# Patient Record
Sex: Female | Born: 1955 | Race: White | Hispanic: No | Marital: Married | Smoking: Former smoker
Health system: Southern US, Community
[De-identification: ages and names within clinical notes are randomized; demographics above are authoritative.]

## PROBLEM LIST (undated history)

## (undated) DIAGNOSIS — M1990 Unspecified osteoarthritis, unspecified site: Secondary | ICD-10-CM

## (undated) DIAGNOSIS — F419 Anxiety disorder, unspecified: Secondary | ICD-10-CM

## (undated) DIAGNOSIS — E785 Hyperlipidemia, unspecified: Secondary | ICD-10-CM

## (undated) DIAGNOSIS — R7303 Prediabetes: Secondary | ICD-10-CM

## (undated) DIAGNOSIS — Z9889 Other specified postprocedural states: Secondary | ICD-10-CM

## (undated) DIAGNOSIS — R112 Nausea with vomiting, unspecified: Secondary | ICD-10-CM

## (undated) DIAGNOSIS — D649 Anemia, unspecified: Secondary | ICD-10-CM

## (undated) DIAGNOSIS — I10 Essential (primary) hypertension: Secondary | ICD-10-CM

## (undated) DIAGNOSIS — K5792 Diverticulitis of intestine, part unspecified, without perforation or abscess without bleeding: Secondary | ICD-10-CM

## (undated) HISTORY — PX: ABDOMINAL HYSTERECTOMY: SHX81

## (undated) HISTORY — PX: WISDOM TOOTH EXTRACTION: SHX21

## (undated) HISTORY — PX: TUBAL LIGATION: SHX77

---

## 2003-12-22 HISTORY — PX: OTHER SURGICAL HISTORY: SHX169

## 2009-04-05 ENCOUNTER — Inpatient Hospital Stay: Payer: Self-pay | Admitting: Psychiatry

## 2009-10-23 ENCOUNTER — Emergency Department: Payer: Self-pay | Admitting: Emergency Medicine

## 2010-11-15 ENCOUNTER — Emergency Department: Payer: Self-pay | Admitting: Emergency Medicine

## 2010-12-27 ENCOUNTER — Inpatient Hospital Stay (HOSPITAL_COMMUNITY): Admission: EM | Admit: 2010-12-27 | Discharge: 2010-12-30 | Payer: Self-pay | Source: Home / Self Care

## 2011-01-05 LAB — CBC
HCT: 38.4 % (ref 36.0–46.0)
HCT: 35.9 % — ABNORMAL LOW (ref 36.0–46.0)
HCT: 33.7 % — ABNORMAL LOW (ref 36.0–46.0)
Hemoglobin: 13.8 g/dL (ref 12.0–15.0)
Hemoglobin: 12.6 g/dL (ref 12.0–15.0)
Hemoglobin: 12 g/dL (ref 12.0–15.0)
MCH: 34.8 pg — ABNORMAL HIGH (ref 26.0–34.0)
MCH: 35.1 pg — ABNORMAL HIGH (ref 26.0–34.0)
MCH: 35.4 pg — ABNORMAL HIGH (ref 26.0–34.0)
MCHC: 35.9 g/dL (ref 30.0–36.0)
MCHC: 35.1 g/dL (ref 30.0–36.0)
MCHC: 35.6 g/dL (ref 30.0–36.0)
MCV: 97 fL (ref 78.0–100.0)
MCV: 100 fL (ref 78.0–100.0)
MCV: 99.4 fL (ref 78.0–100.0)
Platelets: 138 10*3/uL — ABNORMAL LOW (ref 150–400)
Platelets: 118 10*3/uL — ABNORMAL LOW (ref 150–400)
Platelets: 141 10*3/uL — ABNORMAL LOW (ref 150–400)
RBC: 3.96 MIL/uL (ref 3.87–5.11)
RBC: 3.59 MIL/uL — ABNORMAL LOW (ref 3.87–5.11)
RBC: 3.39 MIL/uL — ABNORMAL LOW (ref 3.87–5.11)
RDW: 12.5 % (ref 11.5–15.5)
RDW: 12.6 % (ref 11.5–15.5)
RDW: 12.3 % (ref 11.5–15.5)
WBC: 7.1 10*3/uL (ref 4.0–10.5)
WBC: 5.2 10*3/uL (ref 4.0–10.5)
WBC: 5.4 10*3/uL (ref 4.0–10.5)

## 2011-01-05 LAB — URINALYSIS, ROUTINE W REFLEX MICROSCOPIC
Bilirubin Urine: NEGATIVE
Ketones, ur: NEGATIVE mg/dL
Leukocytes, UA: NEGATIVE
Nitrite: NEGATIVE
Protein, ur: NEGATIVE mg/dL
Specific Gravity, Urine: 1.02 (ref 1.005–1.030)
Urine Glucose, Fasting: NEGATIVE mg/dL
Urobilinogen, UA: 0.2 mg/dL (ref 0.0–1.0)
pH: 6 (ref 5.0–8.0)

## 2011-01-05 LAB — LIPID PANEL
Cholesterol: 180 mg/dL (ref 0–200)
HDL: 83 mg/dL (ref >39)
LDL Cholesterol: 90 mg/dL (ref 0–99)
Total CHOL/HDL Ratio: 2.2 RATIO
Triglycerides: 34 mg/dL (ref <150)
VLDL: 7 mg/dL (ref 0–40)

## 2011-01-05 LAB — MRSA PCR SCREENING: MRSA by PCR: NEGATIVE

## 2011-01-05 LAB — DIFFERENTIAL
Basophils Absolute: 0 10*3/uL (ref 0.0–0.1)
Basophils Absolute: 0 10*3/uL (ref 0.0–0.1)
Basophils Absolute: 0 10*3/uL (ref 0.0–0.1)
Basophils Relative: 0 % (ref 0–1)
Basophils Relative: 1 % (ref 0–1)
Basophils Relative: 1 % (ref 0–1)
Eosinophils Absolute: 0.1 10*3/uL (ref 0.0–0.7)
Eosinophils Absolute: 0.2 10*3/uL (ref 0.0–0.7)
Eosinophils Absolute: 0.1 10*3/uL (ref 0.0–0.7)
Eosinophils Relative: 1 % (ref 0–5)
Eosinophils Relative: 3 % (ref 0–5)
Eosinophils Relative: 2 % (ref 0–5)
Lymphocytes Relative: 25 % (ref 12–46)
Lymphocytes Relative: 31 % (ref 12–46)
Lymphocytes Relative: 35 % (ref 12–46)
Lymphs Abs: 1.8 10*3/uL (ref 0.7–4.0)
Lymphs Abs: 1.6 10*3/uL (ref 0.7–4.0)
Lymphs Abs: 1.9 10*3/uL (ref 0.7–4.0)
Monocytes Absolute: 0.6 10*3/uL (ref 0.1–1.0)
Monocytes Absolute: 0.6 10*3/uL (ref 0.1–1.0)
Monocytes Absolute: 0.6 10*3/uL (ref 0.1–1.0)
Monocytes Relative: 9 % (ref 3–12)
Monocytes Relative: 11 % (ref 3–12)
Monocytes Relative: 10 % (ref 3–12)
Neutro Abs: 4.6 10*3/uL (ref 1.7–7.7)
Neutro Abs: 2.8 10*3/uL (ref 1.7–7.7)
Neutro Abs: 2.8 10*3/uL (ref 1.7–7.7)
Neutrophils Relative %: 65 % (ref 43–77)
Neutrophils Relative %: 54 % (ref 43–77)
Neutrophils Relative %: 52 % (ref 43–77)

## 2011-01-05 LAB — RAPID URINE DRUG SCREEN, HOSP PERFORMED
Amphetamines: NOT DETECTED
Barbiturates: NOT DETECTED
Benzodiazepines: NOT DETECTED
Cocaine: NOT DETECTED
Opiates: NOT DETECTED
Tetrahydrocannabinol: POSITIVE — AB

## 2011-01-05 LAB — MAGNESIUM: Magnesium: 2 mg/dL (ref 1.5–2.5)

## 2011-01-05 LAB — PROTIME-INR
INR: 0.96 (ref 0.00–1.49)
Prothrombin Time: 13 seconds (ref 11.6–15.2)

## 2011-01-05 LAB — BASIC METABOLIC PANEL
BUN: 16 mg/dL (ref 6–23)
CO2: 30 mEq/L (ref 19–32)
Calcium: 10.8 mg/dL — ABNORMAL HIGH (ref 8.4–10.5)
Chloride: 98 mEq/L (ref 96–112)
Creatinine, Ser: 0.77 mg/dL (ref 0.4–1.2)
GFR calc Af Amer: >60 mL/min (ref >60)
GFR calc non Af Amer: >60 mL/min (ref >60)
Glucose, Bld: 114 mg/dL — ABNORMAL HIGH (ref 70–99)
Potassium: 3 mEq/L — ABNORMAL LOW (ref 3.5–5.1)
Sodium: 138 mEq/L (ref 135–145)

## 2011-01-05 LAB — APTT: aPTT: 29 seconds (ref 24–37)

## 2011-01-05 LAB — COMPREHENSIVE METABOLIC PANEL
ALT: 32 U/L (ref 0–35)
AST: 41 U/L — ABNORMAL HIGH (ref 0–37)
Albumin: 3.4 g/dL — ABNORMAL LOW (ref 3.5–5.2)
Alkaline Phosphatase: 51 U/L (ref 39–117)
BUN: 8 mg/dL (ref 6–23)
CO2: 26 mEq/L (ref 19–32)
Calcium: 9.1 mg/dL (ref 8.4–10.5)
Chloride: 107 mEq/L (ref 96–112)
Creatinine, Ser: 0.62 mg/dL (ref 0.4–1.2)
GFR calc Af Amer: >60 mL/min (ref >60)
GFR calc non Af Amer: >60 mL/min (ref >60)
Glucose, Bld: 90 mg/dL (ref 70–99)
Potassium: 3.7 mEq/L (ref 3.5–5.1)
Sodium: 140 mEq/L (ref 135–145)
Total Bilirubin: 1.1 mg/dL (ref 0.3–1.2)
Total Protein: 6 g/dL (ref 6.0–8.3)

## 2011-01-05 LAB — TSH: TSH: 1.104 u[IU]/mL (ref 0.350–4.500)

## 2011-01-05 LAB — URINE MICROSCOPIC-ADD ON

## 2011-01-05 LAB — ETHANOL: Alcohol, Ethyl (B): <5 mg/dL (ref 0–10)

## 2011-01-05 LAB — PHOSPHORUS: Phosphorus: 2.8 mg/dL (ref 2.3–4.6)

## 2011-01-09 NOTE — H&P (Signed)
NAMEEDDITH, MENTOR                 ACCOUNT NO.:  [id number]  MEDICAL RECORD NO.:  [id number]          PATIENT TYPE:  EMS  LOCATION:  ED                            FACILITY:  [facility]  PHYSICIAN:  Kathlen Mody, MD       DATE OF BIRTH:  29-Jun-1956  DATE OF ADMISSION:  12/27/2010 DATE OF DISCHARGE:  LH                             HISTORY & PHYSICAL   PRIMARY CARE PHYSICIAN:  None.  CHIEF COMPLAINT:  The patient was brought in by her husband for increased anxiety, restlessness, and tremors in her hands since this morning.  HISTORY OF PRESENT ILLNESS:  This is a 55 year old lady with a history of hypertension and depression, a history of alcohol abuse, tobacco user, was brought in by her husband this afternoon complaining of restlessness, anxiety, and tremors in her hands since this morning.  The patient denies any chest pain, shortness of breath, syncope.  The patient denies any nausea, vomiting, abdominal pain, diarrhea.  The patient denies urinary complaints.  Denies any headache, tingling or numbness or any weakness anywhere.  Her last EtOH use was on Tuesday. She reports smoking a half a pack of cigarettes every day.  She was in a detox program twice in the past, the last one was 2 years ago, and she said she was at the detox program for less than 3 days.  Additionally, the patient is a poor historian.  The history was available both from the patient and the husband at the bedside.  Patient is complaining of left lower extremity pain, especially pain behind the left knee.  She said she has a history of Baker's cyst over there.  REVIEW OF SYSTEMS:  See HPI, otherwise negative.  PAST MEDICAL HISTORY: 1. Hypertension. 2. Depression. 3. Baker's cyst.  PAST SURGICAL HISTORY: 1. Hysterectomy. 2. Right knee surgery.  SOCIAL HISTORY:  She works as a Lawyer.  She is a heavy EtOH user.  Her last drink was on Tuesday.  She smokes a half a pack of cigarettes for more than 30 years.  She  denies any drug abuse.  She is married and lives with her family.  FAMILY HISTORY:  Not significant.  ALLERGIES:  No known drug allergies.  HOME MEDICATIONS:  Hydrochlorothiazide and Paxil 40 mg daily, which she already took for today.  PERTINENT LABS:  She had a CBC done this morning, which showed a WBC count of 7.1, hemoglobin 13.8, hematocrit 38.4, platelets of 138 with normal differentials.  Alcohol level less than 5.  BMP, which showed a sodium of 138, potassium of 3, chloride 98, bicarb 30, glucose 114, BUN 16, creatinine 0.77, calcium of 10.8.  No radiological studies done at this time.  PHYSICAL EXAMINATION:  VITAL SIGNS:  She is afebrile, temperature of 98.9, a pulse of 82 per minute, respiratory rate of 16 per minute, blood pressure of 105/60, saturating about 96% on room air. GENERAL:  She is alert, restless, she is anxious, she is confused. HEENT:  No JVD.  Pupils are dilated but reacting to light.  Moist mucous membranes. CARDIOVASCULAR:  S1 S2 heard, normal, regular rate  and rhythm. RESPIRATORY:  Good air entry bilateral, no adventitious sounds. ABDOMEN:  Soft, nontender, and nondistended, no organomegaly felt. EXTREMITIES:  No pedal edema, symmetrical dorsalis pedis pulses.  ASSESSMENT AND PLAN:  This is a 55 year old lady with: 1. A history of hypertension, depression, a heavy EtOH user, and     tobacco abuse, came in for increased anxiety,  restlessness, and     tremors.  The patient is probably in an alcohol withdrawal state at     this time.  I would admit the patient to stepdown because she has     tremors in her hands, she is confused, and is having visual     hallucinations.  I would start the patient on intravenous Ativan 1     to 2 mg every 2 hours as needed and hold it for sedation or     respiratory distress.  I would also start the patient on a standing     dose of 2 mg of p.o. Ativan q.6 hours and monitor her for sedation     and respiratory distress.   The patient is already on intravenous     thiamine from tomorrow and we will start the patient on 100 mg of     thiamine daily and 1 mg of folic acid daily.  Since the patient is     restless, I would recommend to put a sitter at the bedside so she     does not get up in confusion and fall.  Put the patient on seizure     precautions and delirium tremens precautions.  2. Hypokalemia.  Patient's potassium is 3.  Give a STAT dose of     intravenous potassium chloride of 60 mEq right now and tomorrow     give her 40 mg of K-Dur in the morning and repeat BMP with     magnesium in the morning. 3. Hypertension.  She already took her blood pressure medications     today.  Her blood pressure is stable at 105/60.  I will hold the     blood pressure medication for another day and restart her later on. 4. Depression.  Patient appears to be confused.  When asked about     being depressed, she said she does not feel depressed, but I would     not stop  Paxil at this time and continue with 40 mg of Paxil     daily. 5. Baker's cyst.  I would get an ultrasound of the knee and some of     the lower extremity behind the knee to see the extension of the     Baker's cyst and treat as needed.  We will start the patient with     pain medications right now with Tylenol.  If Tylenol does not help     then to give her oxycodone 5 mg as needed. 6. For deep venous thrombosis prophylaxis.  Lovenox 40 mg subcu daily. 7. I would also get a urinalysis and a urine drug screen, get a Medical laboratory scientific officer consult for a possible detox program initiation, get a     tobacco cessation consult inpatient, start the patient on a     nicotine patch daily.  The patient is Full Code.  Time spent with the patient about 40 minutes.  ______________________________ Kathlen Mody, MD    [facility]  D:  12/27/2010  T:  12/27/2010  Job:  [id number]  Electronically Signed by Kathlen Mody MD on  01/09/2011 11:23:18 AM

## 2012-01-05 ENCOUNTER — Emergency Department: Payer: Self-pay | Admitting: Emergency Medicine

## 2015-01-06 ENCOUNTER — Inpatient Hospital Stay (HOSPITAL_COMMUNITY)
Admission: EM | Admit: 2015-01-06 | Discharge: 2015-01-09 | DRG: 392 | Disposition: A | Discharge status: Home / Self Care | Payer: 59 | Attending: Family Medicine | Admitting: Family Medicine

## 2015-01-06 ENCOUNTER — Encounter (HOSPITAL_COMMUNITY): Payer: Self-pay | Admitting: *Deleted

## 2015-01-06 ENCOUNTER — Emergency Department (HOSPITAL_COMMUNITY): Payer: 59

## 2015-01-06 DIAGNOSIS — E876 Hypokalemia: Secondary | ICD-10-CM | POA: Diagnosis present

## 2015-01-06 DIAGNOSIS — Z90710 Acquired absence of both cervix and uterus: Secondary | ICD-10-CM

## 2015-01-06 DIAGNOSIS — K5732 Diverticulitis of large intestine without perforation or abscess without bleeding: Secondary | ICD-10-CM | POA: Diagnosis not present

## 2015-01-06 DIAGNOSIS — I10 Essential (primary) hypertension: Secondary | ICD-10-CM | POA: Diagnosis present

## 2015-01-06 DIAGNOSIS — F17210 Nicotine dependence, cigarettes, uncomplicated: Secondary | ICD-10-CM | POA: Diagnosis present

## 2015-01-06 DIAGNOSIS — R109 Unspecified abdominal pain: Secondary | ICD-10-CM

## 2015-01-06 DIAGNOSIS — R1032 Left lower quadrant pain: Secondary | ICD-10-CM | POA: Diagnosis not present

## 2015-01-06 DIAGNOSIS — R1030 Lower abdominal pain, unspecified: Secondary | ICD-10-CM

## 2015-01-06 DIAGNOSIS — K5792 Diverticulitis of intestine, part unspecified, without perforation or abscess without bleeding: Secondary | ICD-10-CM | POA: Diagnosis present

## 2015-01-06 DIAGNOSIS — D649 Anemia, unspecified: Secondary | ICD-10-CM | POA: Diagnosis present

## 2015-01-06 HISTORY — DX: Anemia, unspecified: D64.9

## 2015-01-06 HISTORY — DX: Diverticulitis of intestine, part unspecified, without perforation or abscess without bleeding: K57.92

## 2015-01-06 HISTORY — DX: Essential (primary) hypertension: I10

## 2015-01-06 LAB — COMPREHENSIVE METABOLIC PANEL
ALT: 12 U/L (ref 0–35)
AST: 15 U/L (ref 0–37)
Albumin: 4 g/dL (ref 3.5–5.2)
Alkaline Phosphatase: 58 U/L (ref 39–117)
Anion gap: 6 (ref 5–15)
BUN: 10 mg/dL (ref 6–23)
CO2: 25 mmol/L (ref 19–32)
Calcium: 9.2 mg/dL (ref 8.4–10.5)
Chloride: 108 mEq/L (ref 96–112)
Creatinine, Ser: 0.59 mg/dL (ref 0.50–1.10)
GFR calc Af Amer: >90 mL/min (ref >90)
GFR calc non Af Amer: >90 mL/min (ref >90)
Glucose, Bld: 112 mg/dL — ABNORMAL HIGH (ref 70–99)
Potassium: 3.4 mmol/L — ABNORMAL LOW (ref 3.5–5.1)
Sodium: 139 mmol/L (ref 135–145)
Total Bilirubin: 0.6 mg/dL (ref 0.3–1.2)
Total Protein: 6.8 g/dL (ref 6.0–8.3)

## 2015-01-06 LAB — LIPASE, BLOOD: Lipase: 18 U/L (ref 11–59)

## 2015-01-06 LAB — CBC WITH DIFFERENTIAL/PLATELET
Basophils Absolute: 0 10*3/uL (ref 0.0–0.1)
Basophils Relative: 0 % (ref 0–1)
Eosinophils Absolute: 0.2 10*3/uL (ref 0.0–0.7)
Eosinophils Relative: 1 % (ref 0–5)
HCT: 36.5 % (ref 36.0–46.0)
Hemoglobin: 12.4 g/dL (ref 12.0–15.0)
Lymphocytes Relative: 12 % (ref 12–46)
Lymphs Abs: 1.6 10*3/uL (ref 0.7–4.0)
MCH: 33.2 pg (ref 26.0–34.0)
MCHC: 34 g/dL (ref 30.0–36.0)
MCV: 97.6 fL (ref 78.0–100.0)
Monocytes Absolute: 1.4 10*3/uL — ABNORMAL HIGH (ref 0.1–1.0)
Monocytes Relative: 11 % (ref 3–12)
Neutro Abs: 9.9 10*3/uL — ABNORMAL HIGH (ref 1.7–7.7)
Neutrophils Relative %: 76 % (ref 43–77)
Platelets: 207 10*3/uL (ref 150–400)
RBC: 3.74 MIL/uL — ABNORMAL LOW (ref 3.87–5.11)
RDW: 13.1 % (ref 11.5–15.5)
WBC: 13.1 10*3/uL — ABNORMAL HIGH (ref 4.0–10.5)

## 2015-01-06 MED ORDER — SODIUM CHLORIDE 0.9 % IV SOLN
INTRAVENOUS | Status: DC
  Administered 2015-01-06 – 2015-01-09 (×4): via INTRAVENOUS

## 2015-01-06 MED ORDER — INFLUENZA VAC SPLIT QUAD 0.5 ML IM SUSY
0.5000 mL | PREFILLED_SYRINGE | INTRAMUSCULAR | Status: AC
  Administered 2015-01-07: 0.5 mL via INTRAMUSCULAR
  Filled 2015-01-06: qty 0.5

## 2015-01-06 MED ORDER — CIPROFLOXACIN IN D5W 400 MG/200ML IV SOLN
400.0000 mg | Freq: Once | INTRAVENOUS | Status: DC
  Filled 2015-01-06: qty 200

## 2015-01-06 MED ORDER — CIPROFLOXACIN IN D5W 400 MG/200ML IV SOLN
400.0000 mg | Freq: Two times a day (BID) | INTRAVENOUS | Status: DC
  Administered 2015-01-06 – 2015-01-09 (×6): 400 mg via INTRAVENOUS
  Filled 2015-01-06 (×5): qty 200

## 2015-01-06 MED ORDER — LORAZEPAM 1 MG PO TABS
1.0000 mg | ORAL_TABLET | Freq: Two times a day (BID) | ORAL | Status: DC | PRN
Start: 2015-01-06 — End: 2015-01-09

## 2015-01-06 MED ORDER — HYDROMORPHONE HCL 1 MG/ML IJ SOLN
1.0000 mg | Freq: Once | INTRAMUSCULAR | Status: AC
  Administered 2015-01-06: 1 mg via INTRAVENOUS
  Filled 2015-01-06: qty 1

## 2015-01-06 MED ORDER — METRONIDAZOLE IN NACL 5-0.79 MG/ML-% IV SOLN
500.0000 mg | Freq: Three times a day (TID) | INTRAVENOUS | Status: DC
  Administered 2015-01-07 – 2015-01-09 (×8): 500 mg via INTRAVENOUS
  Filled 2015-01-06 (×8): qty 100

## 2015-01-06 MED ORDER — SODIUM CHLORIDE 0.9 % IV SOLN
INTRAVENOUS | Status: AC
  Administered 2015-01-06: 1000 mL via INTRAVENOUS

## 2015-01-06 MED ORDER — SODIUM CHLORIDE 0.9 % IV SOLN
INTRAVENOUS | Status: DC
  Administered 2015-01-06: 1000 mL via INTRAVENOUS

## 2015-01-06 MED ORDER — SODIUM CHLORIDE 0.9 % IV BOLUS (SEPSIS)
500.0000 mL | Freq: Once | INTRAVENOUS | Status: AC
  Administered 2015-01-06: 500 mL via INTRAVENOUS

## 2015-01-06 MED ORDER — METRONIDAZOLE IN NACL 5-0.79 MG/ML-% IV SOLN: 500.0000 mg | Freq: Three times a day (TID) | INTRAVENOUS | Status: DC

## 2015-01-06 MED ORDER — GABAPENTIN 300 MG PO CAPS
300.0000 mg | ORAL_CAPSULE | Freq: Three times a day (TID) | ORAL | Status: DC
  Administered 2015-01-07 – 2015-01-09 (×8): 300 mg via ORAL
  Filled 2015-01-06 (×8): qty 1

## 2015-01-06 MED ORDER — PNEUMOCOCCAL VAC POLYVALENT 25 MCG/0.5ML IJ INJ
0.5000 mL | INJECTION | INTRAMUSCULAR | Status: AC
  Administered 2015-01-07: 0.5 mL via INTRAMUSCULAR
  Filled 2015-01-06: qty 0.5

## 2015-01-06 MED ORDER — HYDROMORPHONE HCL 1 MG/ML IJ SOLN
0.5000 mg | INTRAMUSCULAR | Status: AC | PRN
  Administered 2015-01-07 (×3): 0.5 mg via INTRAVENOUS
  Filled 2015-01-06 (×3): qty 1

## 2015-01-06 MED ORDER — OMEGA-3-ACID ETHYL ESTERS 1 G PO CAPS
1.0000 g | ORAL_CAPSULE | Freq: Two times a day (BID) | ORAL | Status: DC
  Administered 2015-01-07 – 2015-01-09 (×6): 1 g via ORAL
  Filled 2015-01-06 (×10): qty 1

## 2015-01-06 MED ORDER — IOHEXOL 300 MG/ML  SOLN
50.0000 mL | Freq: Once | INTRAMUSCULAR | Status: AC | PRN
  Administered 2015-01-06: 50 mL via ORAL

## 2015-01-06 MED ORDER — HEPARIN SODIUM (PORCINE) 5000 UNIT/ML IJ SOLN
5000.0000 [IU] | Freq: Three times a day (TID) | INTRAMUSCULAR | Status: DC
  Administered 2015-01-07 – 2015-01-09 (×8): 5000 [IU] via SUBCUTANEOUS
  Filled 2015-01-06 (×9): qty 1

## 2015-01-06 MED ORDER — ONDANSETRON HCL 4 MG/2ML IJ SOLN
4.0000 mg | Freq: Four times a day (QID) | INTRAMUSCULAR | Status: DC | PRN
  Administered 2015-01-07 – 2015-01-09 (×3): 4 mg via INTRAVENOUS
  Filled 2015-01-06 (×3): qty 2

## 2015-01-06 MED ORDER — HYDROMORPHONE HCL 1 MG/ML IJ SOLN: 1.0000 mg | INTRAMUSCULAR | Status: DC | PRN

## 2015-01-06 MED ORDER — ONDANSETRON HCL 4 MG/2ML IJ SOLN: 4.0000 mg | Freq: Three times a day (TID) | INTRAMUSCULAR | Status: DC | PRN

## 2015-01-06 MED ORDER — METRONIDAZOLE IN NACL 5-0.79 MG/ML-% IV SOLN
500.0000 mg | Freq: Once | INTRAVENOUS | Status: AC
  Administered 2015-01-06: 500 mg via INTRAVENOUS
  Filled 2015-01-06: qty 100

## 2015-01-06 MED ORDER — IOHEXOL 300 MG/ML  SOLN
100.0000 mL | Freq: Once | INTRAMUSCULAR | Status: AC | PRN
  Administered 2015-01-06: 100 mL via INTRAVENOUS

## 2015-01-06 MED ORDER — ONDANSETRON HCL 4 MG/2ML IJ SOLN
4.0000 mg | Freq: Once | INTRAMUSCULAR | Status: AC
  Administered 2015-01-06: 4 mg via INTRAVENOUS
  Filled 2015-01-06: qty 2

## 2015-01-06 MED ORDER — HYPROMELLOSE (GONIOSCOPIC) 2.5 % OP SOLN
1.0000 [drp] | OPHTHALMIC | Status: DC | PRN
  Filled 2015-01-06: qty 15

## 2015-01-06 MED ORDER — PROMETHAZINE HCL 25 MG/ML IJ SOLN
12.5000 mg | Freq: Once | INTRAMUSCULAR | Status: AC
  Administered 2015-01-06: 12.5 mg via INTRAVENOUS
  Filled 2015-01-06: qty 1

## 2015-01-06 NOTE — ED Notes (Addendum)
Pt reports lower abd pain over the past week significantly worse today pt reports constant nausea denies vomiting/diarrhea

## 2015-01-06 NOTE — ED Notes (Signed)
Pt states intermittent lower abdominal pain x 1 week. States nausea also. Denies V/D

## 2015-01-06 NOTE — ED Provider Notes (Signed)
CSN: [id number]     Arrival date & time 01/06/15  1748 History  This chart was scribed for Vanetta MuldersScott Alease Fait, MD by Milly JakobJohn Lee Graves, ED Scribe. The patient was seen in room APA01/APA01. Patient's care was started at 6:01 PM.   Chief Complaint  Patient presents with  . Abdominal Pain   Patient is a 59 y.o. female presenting with abdominal pain. The history is provided by the patient. No language interpreter was used.  Abdominal Pain Pain location:  LLQ Pain quality: sharp   Pain radiates to:  Does not radiate Pain severity:  Moderate Onset quality:  Gradual Duration:  7 days Timing:  Intermittent Progression:  Worsening Chronicity:  New Relieved by:  Nothing Worsened by:  Nothing tried Ineffective treatments:  None tried Associated symptoms: chills, fever and nausea   Associated symptoms: no chest pain, no cough, no diarrhea, no dysuria, no shortness of breath, no sore throat and no vomiting    HPI Comments: Kelly [name] is a 59 y.o. female who presents to the Emergency Department complaining of intermittent, lower, left sided, sharp, abdominal pain for the past week that became acutely worse this morning 12 hours ago. She reports associated nausea. She denies vomiting and diarrhea. She reports lightheadedness at work last night. She reports a history of laparoscopic hysterectomy.   [facility]   Past Medical History  Diagnosis Date  . Hypertension    Past Surgical History  Procedure Laterality Date  . Abdominal hysterectomy     No family history on file. History  Substance Use Topics  . Smoking status: Current Every Day Smoker    Types: Cigarettes  . Smokeless tobacco: Not on file  . Alcohol Use: No   OB History    No data available     Review of Systems  Constitutional: Positive for fever and chills.  HENT: Negative for rhinorrhea and sore throat.   Eyes: Positive for visual disturbance.  Respiratory: Negative for cough and shortness of breath.    Cardiovascular: Negative for chest pain and leg swelling.  Gastrointestinal: Positive for nausea and abdominal pain. Negative for vomiting and diarrhea.  Genitourinary: Negative for dysuria.  Musculoskeletal: Positive for back pain. Negative for neck pain.  Skin: Negative for rash.  Neurological: Positive for light-headedness. Negative for dizziness and headaches.  Hematological: Does not bruise/bleed easily.  Psychiatric/Behavioral: Negative for confusion.    Allergies  Bee venom  Home Medications   Prior to Admission medications   Medication Sig Start Date End Date Taking? Authorizing Provider  Aspirin-Caffeine 500-32.5 MG TABS Take 2 tablets by mouth every 6 (six) hours as needed (Pain, Fever).   Yes Historical Provider, MD  Biotin 1000 MCG tablet Take 1,000 mcg by mouth 2 (two) times daily.   Yes Historical Provider, MD  Calcium Carb-Cholecalciferol 600-800 MG-UNIT TABS Take 1 tablet by mouth 2 (two) times daily.   Yes Historical Provider, MD  gabapentin (NEURONTIN) 300 MG capsule Take 300 mg by mouth 3 (three) times daily. 01/01/15  Yes Historical Provider, MD  hydroxypropyl methylcellulose / hypromellose (ISOPTO TEARS / GONIOVISC) 2.5 % ophthalmic solution Place 1 drop into both eyes as needed for dry eyes.   Yes Historical Provider, MD  LORazepam (ATIVAN) 1 MG tablet Take 1 mg by mouth 2 (two) times daily as needed for anxiety or sleep.  01/02/15  Yes Historical Provider, MD  metoprolol tartrate (LOPRESSOR) 25 MG tablet Take 12.5 mg by mouth 2 (two) times daily. 12/12/14  Yes  Historical Provider, MD  Omega-3 Fatty Acids (FISH OIL PO) Take 1 capsule by mouth 2 (two) times daily.   Yes Historical Provider, MD  spironolactone (ALDACTONE) 25 MG tablet Take 12.5 mg by mouth daily. 12/22/14  Yes Historical Provider, MD   Triage Vitals: BP 123/56 mmHg  Pulse 72  Temp(Src) 99.1 F (37.3 C) (Oral)  Resp 18  Ht  (1.676 m)  Wt 120 lb (54.432 kg)  BMI 19.38 kg/m2  SpO2 99% Physical  Exam  Constitutional: She is oriented to person, place, and time. She appears well-developed and well-nourished. No distress.  HENT:  Head: Normocephalic and atraumatic.  Mouth/Throat: Oropharynx is clear and moist.  Eyes: Conjunctivae and EOM are normal. Pupils are equal, round, and reactive to light. No scleral icterus.  Neck: Neck supple. No tracheal deviation present.  Cardiovascular: Normal rate, regular rhythm and normal heart sounds.   No murmur heard. Pulmonary/Chest: Effort normal. She has no wheezes. She has no rales.  Abdominal: Soft. She exhibits no distension. Bowel sounds are decreased. There is no tenderness. There is no rebound and no guarding.  Musculoskeletal: Normal range of motion. She exhibits no edema.  Neurological: She is alert and oriented to person, place, and time.  Skin: Skin is warm and dry.  Psychiatric: She has a normal mood and affect. Her behavior is normal.  Nursing note and vitals reviewed.   ED Course  Procedures (including critical care time) DIAGNOSTIC STUDIES: Oxygen Saturation is 99% on room air, normal by my interpretation.    COORDINATION OF CARE: 6:07 PM-Discussed treatment plan which includes abdominal CT-scan and lab work with pt at bedside and pt agreed to plan.   Results for orders placed or performed during the hospital encounter of 01/06/15  Comprehensive metabolic panel  Result Value Ref Range   Sodium 139 135 - 145 mmol/L   Potassium 3.4 (L) 3.5 - 5.1 mmol/L   Chloride 108 96 - 112 mEq/L   CO2 25 19 - 32 mmol/L   Glucose, Bld 112 (H) 70 - 99 mg/dL   BUN 10 6 - 23 mg/dL   Creatinine, Ser 1.19 0.50 - 1.10 mg/dL   Calcium 9.2 8.4 - 14.7 mg/dL   Total Protein 6.8 6.0 - 8.3 g/dL   Albumin 4.0 3.5 - 5.2 g/dL   AST 15 0 - 37 U/L   ALT 12 0 - 35 U/L   Alkaline Phosphatase 58 39 - 117 U/L   Total Bilirubin 0.6 0.3 - 1.2 mg/dL   GFR calc non Af Amer >90 >90 mL/min   GFR calc Af Amer >90 >90 mL/min   Anion gap 6 5 - 15  Lipase,  blood  Result Value Ref Range   Lipase 18 11 - 59 U/L  CBC with Differential  Result Value Ref Range   WBC 13.1 (H) 4.0 - 10.5 K/uL   RBC 3.74 (L) 3.87 - 5.11 MIL/uL   Hemoglobin 12.4 12.0 - 15.0 g/dL   HCT 82.9 56.2 - 13.0 %   MCV 97.6 78.0 - 100.0 fL   MCH 33.2 26.0 - 34.0 pg   MCHC 34.0 30.0 - 36.0 g/dL   RDW 86.5 78.4 - 69.6 %   Platelets 207 150 - 400 K/uL   Neutrophils Relative % 76 43 - 77 %   Neutro Abs 9.9 (H) 1.7 - 7.7 K/uL   Lymphocytes Relative 12 12 - 46 %   Lymphs Abs 1.6 0.7 - 4.0 K/uL   Monocytes Relative 11 3 -  12 %   Monocytes Absolute 1.4 (H) 0.1 - 1.0 K/uL   Eosinophils Relative 1 0 - 5 %   Eosinophils Absolute 0.2 0.0 - 0.7 K/uL   Basophils Relative 0 0 - 1 %   Basophils Absolute 0.0 0.0 - 0.1 K/uL   Ct Abdomen Pelvis W Contrast  01/06/2015   CLINICAL DATA:  Lower abdominal pain, left-sided, sharp. Worsening this morning, beginning 1 week ago. Associated nausea.  EXAM: CT ABDOMEN AND PELVIS WITH CONTRAST  TECHNIQUE: Multidetector CT imaging of the abdomen and pelvis was performed using the standard protocol following bolus administration of intravenous contrast.  CONTRAST:  50mL OMNIPAQUE IOHEXOL 300 MG/ML SOLN, OMNIPAQUE IOHEXOL 300 MG/ML SOLN  COMPARISON:  None.  FINDINGS: Eight Lung bases are clear.  No effusions.  Heart is normal size.  Liver, gallbladder, spleen, pancreas cava adrenals and kidneys are unremarkable.  Descending colonic and sigmoid diverticulosis. Wall thickening within the proximal to mid descending colon with surrounding inflammatory stranding. This most likely is related to diverticulitis although other infectious colitis cannot be excluded given the length of bowel segment involved. Stomach and small bowel are decompressed and unremarkable. Appendix is visualized and is normal.  Prior hysterectomy. No adnexal masses. Urinary bladder is unremarkable.  Aorta and iliac vessels are normal caliber. Trace free fluid in the pelvis. No free air.  Scattered shotty retroperitoneal lymph nodes, none pathologically enlarged.  No acute bony abnormality or focal bone lesion.  IMPRESSION: Descending colonic and sigmoid diverticulosis. Wall thickening and inflammatory stranding around the proximal and mid descending colon. This likely reflects active diverticulitis although other infectious colitis is possible.   Electronically Signed   By: Charlett Nose M.D.   On: 01/06/2015 21:05    Medications  0.9 %  sodium chloride infusion (not administered)  metroNIDAZOLE (FLAGYL) IVPB 500 mg (not administered)  ciprofloxacin (CIPRO) IVPB 400 mg (not administered)  sodium chloride 0.9 % bolus 500 mL (0 mLs Intravenous Stopped 01/06/15 1918)  ondansetron (ZOFRAN) injection 4 mg (4 mg Intravenous Given 01/06/15 1818)  HYDROmorphone (DILAUDID) injection 1 mg (1 mg Intravenous Given 01/06/15 1818)  promethazine (PHENERGAN) injection 12.5 mg (12.5 mg Intravenous Given 01/06/15 1845)  iohexol (OMNIPAQUE) 300 MG/ML solution 50 mL (50 mLs Oral Contrast Given 01/06/15 2029)  iohexol (OMNIPAQUE) 300 MG/ML solution 100 mL (100 mLs Intravenous Contrast Given 01/06/15 2029)     EKG Interpretation None      MDM   Final diagnoses:  Abdominal pain  Diverticulitis of large intestine without perforation or abscess without bleeding    Patient with diverticulitis most likely based on CT scan. Patient was significant abdominal pain improves some with pain medicine. Also was significant nausea most likely will require inpatient treatment due to the severity of the pain. Patient started on Cipro and Flagyl IV and will consult with hospitalist for adMission.    I personally performed the services described in this documentation, which was scribed in my presence. The recorded information has been reviewed and is accurate.     Vanetta Mulders, MD 01/06/15 2136

## 2015-01-06 NOTE — H&P (Signed)
[facility] History and Physical  Kelly [name] ZDG:[id number] DOB: 03-Jul-1956 DOA: 01/06/2015  Referring physician: ER PCP: No primary care provider on file.   Chief Complaint: Abdominal pain.  HPI: Kelly [name] is a 59 y.o. female  This 59 year old lady has had abdominal pain intermittently for the last 1 week, concentrated mostly on the left side of her abdomen. The pain is colicky in nature. It has been associated with nausea and low-grade fever but no significant diarrhea. There is no rectal bleeding, hematemesis. The pain became much worse today and this is why she came to the hospital. She denies any urinary symptoms. Evaluation in the emergency room shows a CT scan of the abdomen consistent with diverticulitis. She is now being admitted for further management.   Review of Systems:  Apart from symptoms above, all systems negative.  Past Medical History  Diagnosis Date  . Hypertension    Past Surgical History  Procedure Laterality Date  . Abdominal hysterectomy     Social History:  reports that she has been smoking Cigarettes.  She does not have any smokeless tobacco history on file. She reports that she does not drink alcohol. Her drug history is not on file.  Allergies  Allergen Reactions  . Bee Venom Anaphylaxis   Family history: There does not appear to be any family history of colonic diseases.  Prior to Admission medications   Medication Sig Start Date End Date Taking? Authorizing Provider  Aspirin-Caffeine 500-32.5 MG TABS Take 2 tablets by mouth every 6 (six) hours as needed (Pain, Fever).   Yes Historical Provider, MD  Biotin 1000 MCG tablet Take 1,000 mcg by mouth 2 (two) times daily.   Yes Historical Provider, MD  Calcium Carb-Cholecalciferol 600-800 MG-UNIT TABS Take 1 tablet by mouth 2 (two) times daily.   Yes Historical Provider, MD  gabapentin (NEURONTIN) 300 MG capsule Take 300 mg by mouth 3 (three) times daily. 01/01/15  Yes Historical Provider, MD    hydroxypropyl methylcellulose / hypromellose (ISOPTO TEARS / GONIOVISC) 2.5 % ophthalmic solution Place 1 drop into both eyes as needed for dry eyes.   Yes Historical Provider, MD  LORazepam (ATIVAN) 1 MG tablet Take 1 mg by mouth 2 (two) times daily as needed for anxiety or sleep.  01/02/15  Yes Historical Provider, MD  metoprolol tartrate (LOPRESSOR) 25 MG tablet Take 12.5 mg by mouth 2 (two) times daily. 12/12/14  Yes Historical Provider, MD  Omega-3 Fatty Acids (FISH OIL PO) Take 1 capsule by mouth 2 (two) times daily.   Yes Historical Provider, MD  spironolactone (ALDACTONE) 25 MG tablet Take 12.5 mg by mouth daily. 12/22/14  Yes Historical Provider, MD   Physical Exam: Filed Vitals:   01/06/15 1750 01/06/15 2119  BP: 123/56 93/46  Pulse: 72 55  Temp: 99.1 F (37.3 C)   TempSrc: Oral   Resp: 18 16  Height: 5\' 6"  (1.676 m)   Weight: 54.432 kg (120 lb)   SpO2: 99% 100%    Wt Readings from Last 3 Encounters:  01/06/15 54.432 kg (120 lb)    General:  Appears in discomfort from abdominal pain. She is not toxic or septic. Her temperature is 99.1. Eyes: PERRL, normal lids, irises & conjunctiva ENT: grossly normal hearing, lips & tongue Neck: no LAD, masses or thyromegaly Cardiovascular: RRR, no m/r/g. No LE edema. Telemetry: SR, no arrhythmias  Respiratory: CTA bilaterally, no w/r/r. Normal respiratory effort. Abdomen: Soft abdomen, mildly tender in the left lower quadrant and left  mid quadrant. There are no clinical features of an acute abdomen. Skin: no rash or induration seen on limited exam Musculoskeletal: grossly normal tone BUE/BLE Psychiatric: grossly normal mood and affect, speech fluent and appropriate Neurologic: grossly non-focal.          Labs on Admission:  Basic Metabolic Panel:  Recent Labs Lab 01/06/15 1822  NA 139  K 3.4*  CL 108  CO2 25  GLUCOSE 112*  BUN 10  CREATININE 0.59  CALCIUM 9.2   Liver Function Tests:  Recent Labs Lab 01/06/15 1822   AST 15  ALT 12  ALKPHOS 58  BILITOT 0.6  PROT 6.8  ALBUMIN 4.0    Recent Labs Lab 01/06/15 1822  LIPASE 18   No results for input(s): AMMONIA in the last 168 hours. CBC:  Recent Labs Lab 01/06/15 1822  WBC 13.1*  NEUTROABS 9.9*  HGB 12.4  HCT 36.5  MCV 97.6  PLT 207   Cardiac Enzymes: No results for input(s): CKTOTAL, CKMB, CKMBINDEX, TROPONINI in the last 168 hours.  BNP (last 3 results) No results for input(s): PROBNP in the last 8760 hours. CBG: No results for input(s): GLUCAP in the last 168 hours.  Radiological Exams on Admission: Ct Abdomen Pelvis W Contrast  01/06/2015   CLINICAL DATA:  Lower abdominal pain, left-sided, sharp. Worsening this morning, beginning 1 week ago. Associated nausea.  EXAM: CT ABDOMEN AND PELVIS WITH CONTRAST  TECHNIQUE: Multidetector CT imaging of the abdomen and pelvis was performed using the standard protocol following bolus administration of intravenous contrast.  CONTRAST:  50mL OMNIPAQUE IOHEXOL 300 MG/ML SOLN, OMNIPAQUE IOHEXOL 300 MG/ML SOLN  COMPARISON:  None.  FINDINGS: Eight Lung bases are clear.  No effusions.  Heart is normal size.  Liver, gallbladder, spleen, pancreas cava adrenals and kidneys are unremarkable.  Descending colonic and sigmoid diverticulosis. Wall thickening within the proximal to mid descending colon with surrounding inflammatory stranding. This most likely is related to diverticulitis although other infectious colitis cannot be excluded given the length of bowel segment involved. Stomach and small bowel are decompressed and unremarkable. Appendix is visualized and is normal.  Prior hysterectomy. No adnexal masses. Urinary bladder is unremarkable.  Aorta and iliac vessels are normal caliber. Trace free fluid in the pelvis. No free air. Scattered shotty retroperitoneal lymph nodes, none pathologically enlarged.  No acute bony abnormality or focal bone lesion.  IMPRESSION: Descending colonic and sigmoid  diverticulosis. Wall thickening and inflammatory stranding around the proximal and mid descending colon. This likely reflects active diverticulitis although other infectious colitis is possible.   Electronically Signed   By: Charlett Nose M.D.   On: 01/06/2015 21:05     Assessment/Plan   1. Acute diverticulitis, mainly affecting the proximal and descending colon. The patient will be treated with intravenous antibiotics, ciprofloxacin and metronidazole together with analgesia and antiemetics as required. 2. Hypertension, currently blood pressure is somewhat hypotensive. Antihypertensive medication will be held. She will be given intravenous fluids and fluid bolus as required. Her blood pressure will be closely monitored and antihypertensives can be reinstituted once her blood pressure has improved.  Further recommendations will depend on patient's hospital progress.  Code Status: Full code.  DVT Prophylaxis: Heparin.  Family Communication: I discussed the plan with the patient at the bedside.   Disposition Plan: Home when medically stable.   Time spent: 60 minutes.  Wilson Singer [facility] Pager [phone].

## 2015-01-07 ENCOUNTER — Encounter (HOSPITAL_COMMUNITY): Payer: Self-pay | Admitting: Internal Medicine

## 2015-01-07 DIAGNOSIS — E876 Hypokalemia: Secondary | ICD-10-CM | POA: Diagnosis present

## 2015-01-07 DIAGNOSIS — R109 Unspecified abdominal pain: Secondary | ICD-10-CM | POA: Diagnosis present

## 2015-01-07 DIAGNOSIS — K5712 Diverticulitis of small intestine without perforation or abscess without bleeding: Secondary | ICD-10-CM

## 2015-01-07 LAB — CBC
HEMATOCRIT: 32.2 % — AB (ref 36.0–46.0)
HEMOGLOBIN: 10.9 g/dL — AB (ref 12.0–15.0)
MCH: 34 pg (ref 26.0–34.0)
MCHC: 33.9 g/dL (ref 30.0–36.0)
MCV: 100.3 fL — AB (ref 78.0–100.0)
PLATELETS: 168 10*3/uL (ref 150–400)
RBC: 3.21 MIL/uL — AB (ref 3.87–5.11)
RDW: 13.3 % (ref 11.5–15.5)
WBC: 11 10*3/uL — ABNORMAL HIGH (ref 4.0–10.5)

## 2015-01-07 LAB — COMPREHENSIVE METABOLIC PANEL
ALK PHOS: 48 U/L (ref 39–117)
ALT: 10 U/L (ref 0–35)
ANION GAP: 6 (ref 5–15)
AST: 12 U/L (ref 0–37)
Albumin: 3.1 g/dL — ABNORMAL LOW (ref 3.5–5.2)
BILIRUBIN TOTAL: 0.7 mg/dL (ref 0.3–1.2)
BUN: 6 mg/dL (ref 6–23)
CO2: 24 mmol/L (ref 19–32)
CREATININE: 0.59 mg/dL (ref 0.50–1.10)
Calcium: 8 mg/dL — ABNORMAL LOW (ref 8.4–10.5)
Chloride: 109 mEq/L (ref 96–112)
Glucose, Bld: 87 mg/dL (ref 70–99)
Potassium: 3.2 mmol/L — ABNORMAL LOW (ref 3.5–5.1)
SODIUM: 139 mmol/L (ref 135–145)
Total Protein: 5.5 g/dL — ABNORMAL LOW (ref 6.0–8.3)

## 2015-01-07 MED ORDER — POLYVINYL ALCOHOL 1.4 % OP SOLN: 1.0000 [drp] | OPHTHALMIC | Status: DC | PRN

## 2015-01-07 MED ORDER — HYDROMORPHONE HCL 1 MG/ML IJ SOLN
0.5000 mg | Freq: Once | INTRAMUSCULAR | Status: AC
  Administered 2015-01-07: 0.5 mg via INTRAVENOUS
  Filled 2015-01-07: qty 1

## 2015-01-07 MED ORDER — POTASSIUM CHLORIDE CRYS ER 20 MEQ PO TBCR
40.0000 meq | EXTENDED_RELEASE_TABLET | Freq: Once | ORAL | Status: AC
  Administered 2015-01-07: 40 meq via ORAL
  Filled 2015-01-07: qty 2

## 2015-01-07 NOTE — Care Management Utilization Note (Signed)
UR completed 

## 2015-01-07 NOTE — Care Management Note (Signed)
    Page 1 of 1   01/07/2015     10:58:48 AM CARE MANAGEMENT NOTE 01/07/2015  Patient:  Kelly [name],Kelly [name]   Account Number:  [id number]  Date Initiated:  01/07/2015  Documentation initiated by:  Kathyrn SheriffHILDRESS,JESSICA  Subjective/Objective Assessment:   Pt admitted with diverticulitis. Pt lives at home with husband. Pt in [place] and works as LawyerCNA at [employer]. Pt[facility]. Pt plans to discharge home with self care. Pt has no CM needs.     Action/Plan:   Anticipated DC Date:  01/09/2015   Anticipated DC Plan:  HOME/SELF CARE      DC Planning Services  CM consult      Choice offered to / List presented to:             Status of service:  Completed, signed off Medicare Important Message given?   (If response is "NO", the following Medicare IM given date fields will be blank) Date Medicare IM given:   Medicare IM given by:   Date Additional Medicare IM given:   Additional Medicare IM given by:    Discharge Disposition:  HOME/SELF CARE  Per UR Regulation:    If discussed at Long Length of Stay Meetings, dates discussed:    Comments:  01/07/2015 1045 Kathyrn SheriffJessica Childress, RN, MSN, [facility]

## 2015-01-07 NOTE — Progress Notes (Signed)
[facility] PROGRESS NOTE  Kelly [name] AVW:[id number] DOB: [birth year] DOA: 01/06/2015 PCP: No primary care provider on file.  Assessment/Plan: Principal Problem:   Diverticulitis: per CT. Reports only marginal improvement in pain this am. Tolerating clear liquids. Continue Cipro and Flagyl day #2. No N/V/Diarrhea. No BRBPR. Monitor. Advance diet when indicated likely tomorrow Active Problems:   Abdominal pain: related to above.  slight improvement continue dilaudid.     Hypertension: remains somewhat soft. Continue to hold metoprolol and spironolactone. Monitor closely    Hypokalemia: mild. Related to decreased po intake. Replace and recheck.   Anemia: mild. May be somewhat dilutional. No s/sx bleeding. Monitor.       Code Status: full Family Communication: none present Disposition Plan: home when ready likely 48 hours   Consultants:  none  Procedures:  none  Antibiotics:  cipro 01/06/15>>  Flagyl 01/06/15>>  HPI/Subjective: Reports only slight improvement in abdominal pain. No nausea/vomiting/diarrhea  Objective: Filed Vitals:   01/07/15 0646  BP: 103/50  Pulse: 61  Temp: 98.7 F (37.1 C)  Resp: 18    Intake/Output Summary (Last 24 hours) at 01/07/15 0939 Last data filed at 01/07/15 0905  Gross per 24 hour  Intake 1686.25 ml  Output    300 ml  Net 1386.25 ml   Filed Weights   01/06/15 1750 01/06/15 2300  Weight: 54.432 kg (120 lb) 57.743 kg (127 lb 4.8 oz)    Exam:   General:  Well nourished appears comfortable  Cardiovascular: RRR No MGR No LE edema  Respiratory: normal effort BS clear bilaterally no wheeze  Abdomen: soft, mild tenderness particularly in lower quadrants hyperactive BS throughout  Musculoskeletal: no clubbing or cyanosis   Data Reviewed: Basic Metabolic Panel:  Recent Labs Lab 01/06/15 1822 01/07/15 0519  NA 139 139  K 3.4* 3.2*  CL 108 109  CO2 25 24  GLUCOSE 112* 87  BUN 10 6  CREATININE 0.59 0.59   CALCIUM 9.2 8.0*   Liver Function Tests:  Recent Labs Lab 01/06/15 1822 01/07/15 0519  AST 15 12  ALT 12 10  ALKPHOS 58 48  BILITOT 0.6 0.7  PROT 6.8 5.5*  ALBUMIN 4.0 3.1*    Recent Labs Lab 01/06/15 1822  LIPASE 18   No results for input(s): AMMONIA in the last 168 hours. CBC:  Recent Labs Lab 01/06/15 1822 01/07/15 0519  WBC 13.1* 11.0*  NEUTROABS 9.9*  --   HGB 12.4 10.9*  HCT 36.5 32.2*  MCV 97.6 100.3*  PLT 207 168   Cardiac Enzymes: No results for input(s): CKTOTAL, CKMB, CKMBINDEX, TROPONINI in the last 168 hours. BNP (last 3 results) No results for input(s): PROBNP in the last 8760 hours. CBG: No results for input(s): GLUCAP in the last 168 hours.  No results found for this or any previous visit (from the past 240 hour(s)).   Studies: Ct Abdomen Pelvis W Contrast  01/06/2015   CLINICAL DATA:  Lower abdominal pain, left-sided, sharp. Worsening this morning, beginning 1 week ago. Associated nausea.  EXAM: CT ABDOMEN AND PELVIS WITH CONTRAST  TECHNIQUE: Multidetector CT imaging of the abdomen and pelvis was performed using the standard protocol following bolus administration of intravenous contrast.  CONTRAST:  50mL OMNIPAQUE IOHEXOL 300 MG/ML SOLN, OMNIPAQUE IOHEXOL 300 MG/ML SOLN  COMPARISON:  None.  FINDINGS: Eight Lung bases are clear.  No effusions.  Heart is normal size.  Liver, gallbladder, spleen, pancreas cava adrenals and kidneys are unremarkable.  Descending colonic and sigmoid diverticulosis.  Wall thickening within the proximal to mid descending colon with surrounding inflammatory stranding. This most likely is related to diverticulitis although other infectious colitis cannot be excluded given the length of bowel segment involved. Stomach and small bowel are decompressed and unremarkable. Appendix is visualized and is normal.  Prior hysterectomy. No adnexal masses. Urinary bladder is unremarkable.  Aorta and iliac vessels are normal caliber.  Trace free fluid in the pelvis. No free air. Scattered shotty retroperitoneal lymph nodes, none pathologically enlarged.  No acute bony abnormality or focal bone lesion.  IMPRESSION: Descending colonic and sigmoid diverticulosis. Wall thickening and inflammatory stranding around the proximal and mid descending colon. This likely reflects active diverticulitis although other infectious colitis is possible.   Electronically Signed   By: Charlett NoseKevin  Dover M.D.   On: 01/06/2015 21:05    Scheduled Meds: . sodium chloride   Intravenous STAT  . ciprofloxacin  400 mg Intravenous Q12H  . gabapentin  300 mg Oral TID  . heparin  5,000 Units Subcutaneous 3 times per day  . Influenza vac split quadrivalent PF  0.5 mL Intramuscular Tomorrow-1000  . metronidazole  500 mg Intravenous Q8H  . omega-3 acid ethyl esters  1 g Oral BID  . pneumococcal 23 valent vaccine  0.5 mL Intramuscular Tomorrow-1000   Continuous Infusions: . sodium chloride 125 mL/hr at 01/07/15 0735      Time spent: 35 minutes    [facility] Development[facility] CenterBLACK,Dewon Mendizabal M  [facility] Pager [phone]. If 7PM-7AM, please contact night-coverage at [email or web address], password [email or web address] 01/07/2015, 9:39 AM  LOS: 1 day

## 2015-01-08 LAB — BASIC METABOLIC PANEL
Anion gap: 4 — ABNORMAL LOW (ref 5–15)
CO2: 28 mmol/L (ref 19–32)
CREATININE: 0.55 mg/dL (ref 0.50–1.10)
Calcium: 8.6 mg/dL (ref 8.4–10.5)
Chloride: 111 mEq/L (ref 96–112)
GFR calc Af Amer: >90 mL/min (ref >90)
GFR calc non Af Amer: >90 mL/min (ref >90)
GLUCOSE: 96 mg/dL (ref 70–99)
POTASSIUM: 3.9 mmol/L (ref 3.5–5.1)
Sodium: 143 mmol/L (ref 135–145)

## 2015-01-08 LAB — CBC
HEMATOCRIT: 34 % — AB (ref 36.0–46.0)
HEMOGLOBIN: 11.1 g/dL — AB (ref 12.0–15.0)
MCH: 33.1 pg (ref 26.0–34.0)
MCHC: 32.6 g/dL (ref 30.0–36.0)
MCV: 101.5 fL — AB (ref 78.0–100.0)
Platelets: 170 10*3/uL (ref 150–400)
RBC: 3.35 MIL/uL — ABNORMAL LOW (ref 3.87–5.11)
RDW: 13.5 % (ref 11.5–15.5)
WBC: 6.3 10*3/uL (ref 4.0–10.5)

## 2015-01-08 MED ORDER — HYDROMORPHONE HCL 1 MG/ML IJ SOLN
0.5000 mg | INTRAMUSCULAR | Status: DC | PRN
  Administered 2015-01-08 – 2015-01-09 (×4): 0.5 mg via INTRAVENOUS
  Filled 2015-01-08 (×4): qty 1

## 2015-01-08 NOTE — Progress Notes (Signed)
[facility] PROGRESS NOTE  Kelly [name] WUJ:[id number] DOB: 1956-06-26 DOA: 01/06/2015 PCP: No primary care provider on file.  Assessment/Plan: Principal Problem:  Diverticulitis: per CT. improved yesterday to the point that her diet was advanced to soft foods. Has experienced worsening abdominal pain since eating sausage at breakfast. Reports she only ate a few bites of green beans last evening as eating increased her pain. Also complaining of abdominal distention this morning. Abdominal exam remains nonacute. Continue Cipro and Flagyl day #3.  No N/V/Diarrhea. No BRBPR. I will change her diet to full liquids provide when necessary pain medicine and monitor. If no improvement will consider re-imaging. Will try to Advance diet when indicated likely tomorrow Active Problems:  Abdominal pain: See #1    Hypertension: remains somewhat soft. Continue to hold metoprolol and spironolactone. Monitor closely   Hypokalemia: Resolved.   Anemia: mild. May be somewhat dilutional. No s/sx bleeding. Monitor.     Code Status: full Family Communication: none present Disposition Plan: home when ready   Consultants:  none  Procedures:  none  Antibiotics:  cipro 01/06/15>>  Flagyl 01/06/15>>  HPI/Subjective: Complains of worsening lower abdominal pain after eating breakfast. Also reports mild nausea no emesis  Objective: Filed Vitals:   01/08/15 0659  BP: 100/39  Pulse: 50  Temp: 98.9 F (37.2 C)  Resp: 20    Intake/Output Summary (Last 24 hours) at 01/08/15 1058 Last data filed at 01/08/15 0800  Gross per 24 hour  Intake 3793.33 ml  Output    700 ml  Net 3093.33 ml   Filed Weights   01/06/15 1750 01/06/15 2300  Weight: 54.432 kg (120 lb) 57.743 kg (127 lb 4.8 oz)    Exam:   General:  Well-nourished obviously uncomfortable  Cardiovascular: Irregular rate and rhythm no murmur no gallop no rub  Respiratory: Normal effort breath sounds clear bilaterally no  wheeze  Abdomen: Nondistended soft positive bowel sounds diffuse tenderness to palpation throughout  Musculoskeletal: Joints without swelling/erythema  Data Reviewed: Basic Metabolic Panel:  Recent Labs Lab 01/06/15 1822 01/07/15 0519 01/08/15 0525  NA 139 139 143  K 3.4* 3.2* 3.9  CL 108 109 111  CO2 25 24 28   GLUCOSE 112* 87 96  BUN 10 6 <5*  CREATININE 0.59 0.59 0.55  CALCIUM 9.2 8.0* 8.6   Liver Function Tests:  Recent Labs Lab 01/06/15 1822 01/07/15 0519  AST 15 12  ALT 12 10  ALKPHOS 58 48  BILITOT 0.6 0.7  PROT 6.8 5.5*  ALBUMIN 4.0 3.1*    Recent Labs Lab 01/06/15 1822  LIPASE 18   No results for input(s): AMMONIA in the last 168 hours. CBC:  Recent Labs Lab 01/06/15 1822 01/07/15 0519 01/08/15 0525  WBC 13.1* 11.0* 6.3  NEUTROABS 9.9*  --   --   HGB 12.4 10.9* 11.1*  HCT 36.5 32.2* 34.0*  MCV 97.6 100.3* 101.5*  PLT 207 168 170   Cardiac Enzymes: No results for input(s): CKTOTAL, CKMB, CKMBINDEX, TROPONINI in the last 168 hours. BNP (last 3 results) No results for input(s): PROBNP in the last 8760 hours. CBG: No results for input(s): GLUCAP in the last 168 hours.  No results found for this or any previous visit (from the past 240 hour(s)).   Studies: Ct Abdomen Pelvis W Contrast  01/06/2015   CLINICAL DATA:  Lower abdominal pain, left-sided, sharp. Worsening this morning, beginning 1 week ago. Associated nausea.  EXAM: CT ABDOMEN AND PELVIS WITH CONTRAST  TECHNIQUE: Multidetector CT  imaging of the abdomen and pelvis was performed using the standard protocol following bolus administration of intravenous contrast.  CONTRAST:  50mL OMNIPAQUE IOHEXOL 300 MG/ML SOLN, OMNIPAQUE IOHEXOL 300 MG/ML SOLN  COMPARISON:  None.  FINDINGS: Eight Lung bases are clear.  No effusions.  Heart is normal size.  Liver, gallbladder, spleen, pancreas cava adrenals and kidneys are unremarkable.  Descending colonic and sigmoid diverticulosis. Wall thickening  within the proximal to mid descending colon with surrounding inflammatory stranding. This most likely is related to diverticulitis although other infectious colitis cannot be excluded given the length of bowel segment involved. Stomach and small bowel are decompressed and unremarkable. Appendix is visualized and is normal.  Prior hysterectomy. No adnexal masses. Urinary bladder is unremarkable.  Aorta and iliac vessels are normal caliber. Trace free fluid in the pelvis. No free air. Scattered shotty retroperitoneal lymph nodes, none pathologically enlarged.  No acute bony abnormality or focal bone lesion.  IMPRESSION: Descending colonic and sigmoid diverticulosis. Wall thickening and inflammatory stranding around the proximal and mid descending colon. This likely reflects active diverticulitis although other infectious colitis is possible.   Electronically Signed   By: Charlett Nose M.D.   On: 01/06/2015 21:05    Scheduled Meds: . ciprofloxacin  400 mg Intravenous Q12H  . gabapentin  300 mg Oral TID  . heparin  5,000 Units Subcutaneous 3 times per day  . metronidazole  500 mg Intravenous Q8H  . omega-3 acid ethyl esters  1 g Oral BID   Continuous Infusions: . sodium chloride 75 mL/hr at 01/07/15 1143    Principal Problem:   Diverticulitis Active Problems:   Hypertension   Abdominal pain   Hypokalemia    Time spent: 35 minutes    [facility] M  [facility] Pager [phone]. If 7PM-7AM, please contact night-coverage at [email or web address], password [facility] 01/08/2015, 10:58 AM  LOS: 2 days

## 2015-01-09 DIAGNOSIS — K5732 Diverticulitis of large intestine without perforation or abscess without bleeding: Secondary | ICD-10-CM | POA: Insufficient documentation

## 2015-01-09 LAB — CBC
HCT: 31.1 % — ABNORMAL LOW (ref 36.0–46.0)
Hemoglobin: 10.4 g/dL — ABNORMAL LOW (ref 12.0–15.0)
MCH: 33.3 pg (ref 26.0–34.0)
MCHC: 33.4 g/dL (ref 30.0–36.0)
MCV: 99.7 fL (ref 78.0–100.0)
Platelets: 157 10*3/uL (ref 150–400)
RBC: 3.12 MIL/uL — AB (ref 3.87–5.11)
RDW: 13.2 % (ref 11.5–15.5)
WBC: 4.4 10*3/uL (ref 4.0–10.5)

## 2015-01-09 MED ORDER — PROMETHAZINE HCL 12.5 MG PO TABS: 12.5000 mg | ORAL_TABLET | Freq: Three times a day (TID) | ORAL | Status: DC | PRN

## 2015-01-09 MED ORDER — HYDROCODONE-ACETAMINOPHEN 5-325 MG PO TABS
1.0000 | ORAL_TABLET | Freq: Four times a day (QID) | ORAL | Status: DC | PRN
Start: 2015-01-09 — End: 2022-05-11

## 2015-01-09 MED ORDER — CIPROFLOXACIN HCL 500 MG PO TABS: 500.0000 mg | ORAL_TABLET | Freq: Two times a day (BID) | ORAL | Status: DC

## 2015-01-09 MED ORDER — METRONIDAZOLE 500 MG PO TABS: 500.0000 mg | ORAL_TABLET | Freq: Three times a day (TID) | ORAL | Status: DC

## 2015-01-09 NOTE — Discharge Summary (Signed)
Physician Discharge Summary  Kelly [name]:[id number] DOB: 27-Sep-1956 DOA: 01/06/2015  PCP: No primary care provider on file.  Admit date: 01/06/2015 Discharge date: 01/09/2015  Time spent: 40  minutes  Recommendations for Outpatient Follow-up:  1. Dr Clovis Pu PCP 01/15/15 resolution of diverticulitis 2. Advance diet as tolerated  Discharge Diagnoses:  Principal Problem:   Diverticulitis Active Problems:   Hypertension   Abdominal pain   Hypokalemia   Discharge Condition: stable  Diet recommendation: soft advance as tolerated  Filed Weights   01/06/15 1750 01/06/15 2300  Weight: 54.432 kg (120 lb) 57.743 kg (127 lb 4.8 oz)    History of present illness:  This 59 year old lady  had abdominal pain intermittently for 1 week, concentrated mostly on the left side of her abdomen when she presented on 01/06/15. The pain was colicky in nature. It had been associated with nausea and low-grade fever but no significant diarrhea. There was no rectal bleeding, hematemesis. The pain became much worse and this is why she came to the hospital. She denied any urinary symptoms. Evaluation in the emergency room showed a CT scan of the abdomen consistent with diverticulitis.   Hospital Course:  Diverticulitis: per CT. Admitted and provided with bowel rest and antibiotics.  Patient with increased pain/bloating when diet advanced 01/07/15 so back to full liquids. she is tolerating full liquids at discharge. Abdominal exam remains nonacute. Will continue Cipro and Flagyl for 10 more days to complete 14 day course. No N/V/Diarrhea. No BRBPR. Has follow up appointment with PCP 01/15/15. Active Problems:   Hypertension: stable at discharge   Hypokalemia: Resolved.   Anemia: mild. OP follow up. No s/sx bleeding  Procedures:  none  Consultations:  none  Discharge Exam: Filed Vitals:   01/09/15 1411  BP: 121/59  Pulse: 50  Temp: 97.8 F (36.6 C)  Resp: 18    General: well nourished  appears comfortable Cardiovascular: RRR no MGR No LE edema Respiratory: normal effort BS clear to auscultation Abdomen: soft non-distended non-tender to palpation  Discharge Instructions    Current Discharge Medication List    START taking these medications   Details  ciprofloxacin (CIPRO) 500 MG tablet Take 1 tablet (500 mg total) by mouth 2 (two) times daily. Qty: 20 tablet, Refills: 0    HYDROcodone-acetaminophen (NORCO/VICODIN) 5-325 MG per tablet Take 1 tablet by mouth every 6 (six) hours as needed for moderate pain. Qty: 20 tablet, Refills: 0    metroNIDAZOLE (FLAGYL) 500 MG tablet Take 1 tablet (500 mg total) by mouth 3 (three) times daily. Qty: 30 tablet, Refills: 0    promethazine (PHENERGAN) 12.5 MG tablet Take 1 tablet (12.5 mg total) by mouth every 8 (eight) hours as needed for nausea or vomiting. Qty: 10 tablet, Refills: 0      CONTINUE these medications which have NOT CHANGED   Details  Aspirin-Caffeine 500-32.5 MG TABS Take 2 tablets by mouth every 6 (six) hours as needed (Pain, Fever).    Biotin 1000 MCG tablet Take 1,000 mcg by mouth 2 (two) times daily.    Calcium Carb-Cholecalciferol 600-800 MG-UNIT TABS Take 1 tablet by mouth 2 (two) times daily.    gabapentin (NEURONTIN) 300 MG capsule Take 300 mg by mouth 3 (three) times daily.    hydroxypropyl methylcellulose / hypromellose (ISOPTO TEARS / GONIOVISC) 2.5 % ophthalmic solution Place 1 drop into both eyes as needed for dry eyes.    LORazepam (ATIVAN) 1 MG tablet Take 1 mg by mouth 2 (two) times  daily as needed for anxiety or sleep.     metoprolol tartrate (LOPRESSOR) 25 MG tablet Take 12.5 mg by mouth 2 (two) times daily.    Omega-3 Fatty Acids (FISH OIL PO) Take 1 capsule by mouth 2 (two) times daily.    spironolactone (ALDACTONE) 25 MG tablet Take 12.5 mg by mouth daily.       Allergies  Allergen Reactions  . Bee Venom Anaphylaxis   Follow-up Information    Follow up with Clovis PuVan Horn, Arturo MortonJill E,  DO On 01/15/2015.   Specialty:  Osteopathic Medicine   Why:  appointment at 10:15   Contact information:   [address] [place] [address] [phone]        The results of significant diagnostics from this hospitalization (including imaging, microbiology, ancillary and laboratory) are listed below for reference.    Significant Diagnostic Studies: Ct Abdomen Pelvis W Contrast  01/06/2015   CLINICAL DATA:  Lower abdominal pain, left-sided, sharp. Worsening this morning, beginning 1 week ago. Associated nausea.  EXAM: CT ABDOMEN AND PELVIS WITH CONTRAST  TECHNIQUE: Multidetector CT imaging of the abdomen and pelvis was performed using the standard protocol following bolus administration of intravenous contrast.  CONTRAST:  50mL OMNIPAQUE IOHEXOL 300 MG/ML SOLN, 100mL OMNIPAQUE IOHEXOL 300 MG/ML SOLN  COMPARISON:  None.  FINDINGS: Eight Lung bases are clear.  No effusions.  Heart is normal size.  Liver, gallbladder, spleen, pancreas cava adrenals and kidneys are unremarkable.  Descending colonic and sigmoid diverticulosis. Wall thickening within the proximal to mid descending colon with surrounding inflammatory stranding. This most likely is related to diverticulitis although other infectious colitis cannot be excluded given the length of bowel segment involved. Stomach and small bowel are decompressed and unremarkable. Appendix is visualized and is normal.  Prior hysterectomy. No adnexal masses. Urinary bladder is unremarkable.  Aorta and iliac vessels are normal caliber. Trace free fluid in the pelvis. No free air. Scattered shotty retroperitoneal lymph nodes, none pathologically enlarged.  No acute bony abnormality or focal bone lesion.  IMPRESSION: Descending colonic and sigmoid diverticulosis. Wall thickening and inflammatory stranding around the proximal and mid descending colon. This likely reflects active diverticulitis although other infectious colitis is possible.   Electronically Signed    By: Charlett NoseKevin  Dover M.D.   On: 01/06/2015 21:05    Microbiology: No results found for this or any previous visit (from the past 240 hour(s)).   Labs: Basic Metabolic Panel:  Recent Labs Lab 01/06/15 1822 01/07/15 0519 01/08/15 0525  NA 139 139 143  K 3.4* 3.2* 3.9  CL 108 109 111  CO2 25 24 28   GLUCOSE 112* 87 96  BUN 10 6 <5*  CREATININE 0.59 0.59 0.55  CALCIUM 9.2 8.0* 8.6   Liver Function Tests:  Recent Labs Lab 01/06/15 1822 01/07/15 0519  AST 15 12  ALT 12 10  ALKPHOS 58 48  BILITOT 0.6 0.7  PROT 6.8 5.5*  ALBUMIN 4.0 3.1*    Recent Labs Lab 01/06/15 1822  LIPASE 18   No results for input(s): AMMONIA in the last 168 hours. CBC:  Recent Labs Lab 01/06/15 1822 01/07/15 0519 01/08/15 0525 01/09/15 0734  WBC 13.1* 11.0* 6.3 4.4  NEUTROABS 9.9*  --   --   --   HGB 12.4 10.9* 11.1* 10.4*  HCT 36.5 32.2* 34.0* 31.1*  MCV 97.6 100.3* 101.5* 99.7  PLT 207 168 170 157   Cardiac Enzymes: No results for input(s): CKTOTAL, CKMB, CKMBINDEX, TROPONINI in the last 168  hours. BNP: BNP (last 3 results) No results for input(s): PROBNP in the last 8760 hours. CBG: No results for input(s): GLUCAP in the last 168 hours.     SignedGwenyth Bender  [facility] 01/09/2015, 3:24 PM

## 2015-01-09 NOTE — Progress Notes (Signed)
Pt discharged home today per Dr. Goodrich.  Pt's IV site D/C'd and WDL.  Pt's VSS.  Pt provided with home medication list, discharge instructions and prescriptions.  Verbalized understanding.  Pt left floor via WC in stable condition accompanied by RN. 

## 2016-09-11 ENCOUNTER — Ambulatory Visit (HOSPITAL_COMMUNITY)
Admission: RE | Admit: 2016-09-11 | Discharge: 2016-09-11 | Disposition: A | Discharge status: Home / Self Care | Payer: 59 | Source: Ambulatory Visit | Attending: Neurology | Admitting: Neurology

## 2016-09-11 ENCOUNTER — Other Ambulatory Visit: Payer: Self-pay | Admitting: Neurology

## 2016-09-11 DIAGNOSIS — M549 Dorsalgia, unspecified: Secondary | ICD-10-CM

## 2016-09-11 DIAGNOSIS — M47894 Other spondylosis, thoracic region: Secondary | ICD-10-CM | POA: Diagnosis not present

## 2016-09-11 DIAGNOSIS — M545 Low back pain: Secondary | ICD-10-CM | POA: Diagnosis present

## 2016-09-11 DIAGNOSIS — M542 Cervicalgia: Secondary | ICD-10-CM

## 2019-06-05 ENCOUNTER — Other Ambulatory Visit: Payer: Self-pay | Admitting: Neurology

## 2019-06-05 ENCOUNTER — Other Ambulatory Visit (HOSPITAL_COMMUNITY): Payer: Self-pay | Admitting: Neurology

## 2019-06-05 DIAGNOSIS — M5412 Radiculopathy, cervical region: Secondary | ICD-10-CM

## 2019-06-13 ENCOUNTER — Ambulatory Visit (HOSPITAL_COMMUNITY)
Admission: RE | Admit: 2019-06-13 | Discharge: 2019-06-13 | Disposition: A | Discharge status: Home / Self Care | Payer: 59 | Source: Ambulatory Visit | Attending: Neurology | Admitting: Neurology

## 2019-06-13 ENCOUNTER — Other Ambulatory Visit: Payer: Self-pay

## 2019-06-13 DIAGNOSIS — M5412 Radiculopathy, cervical region: Secondary | ICD-10-CM | POA: Diagnosis present

## 2019-08-24 ENCOUNTER — Other Ambulatory Visit: Payer: Self-pay | Admitting: Neurology

## 2019-08-24 DIAGNOSIS — M5412 Radiculopathy, cervical region: Secondary | ICD-10-CM

## 2019-09-01 ENCOUNTER — Ambulatory Visit
Admission: RE | Admit: 2019-09-01 | Discharge: 2019-09-01 | Disposition: A | Discharge status: Home / Self Care | Payer: 59 | Source: Ambulatory Visit | Attending: Neurology | Admitting: Neurology

## 2019-09-01 ENCOUNTER — Other Ambulatory Visit: Payer: Self-pay

## 2019-09-01 DIAGNOSIS — M5412 Radiculopathy, cervical region: Secondary | ICD-10-CM

## 2019-09-01 MED ORDER — IOPAMIDOL (ISOVUE-M 300) INJECTION 61%
1.0000 mL | Freq: Once | INTRAMUSCULAR | Status: AC
  Administered 2019-09-01: 1 mL via EPIDURAL

## 2019-09-01 MED ORDER — TRIAMCINOLONE ACETONIDE 40 MG/ML IJ SUSP (RADIOLOGY)
60.0000 mg | Freq: Once | INTRAMUSCULAR | Status: AC
  Administered 2019-09-01: 14:00:00 60 mg via EPIDURAL

## 2019-09-01 NOTE — Discharge Instructions (Signed)

## 2020-04-30 IMAGING — XA DG INJECT/[PERSON_NAME] INC NEEDLE/CATH/PLC EPI/CERV/THOR W/IMG
2 series · 2 of 2 positions shown · non-contrast
Comparison: none

CLINICAL DATA: Cervical radiculopathy.  C6 symptoms.

[Series 1: ortho standard · 1 of 1 slices shown (1 of 2)]
[im 1/1]
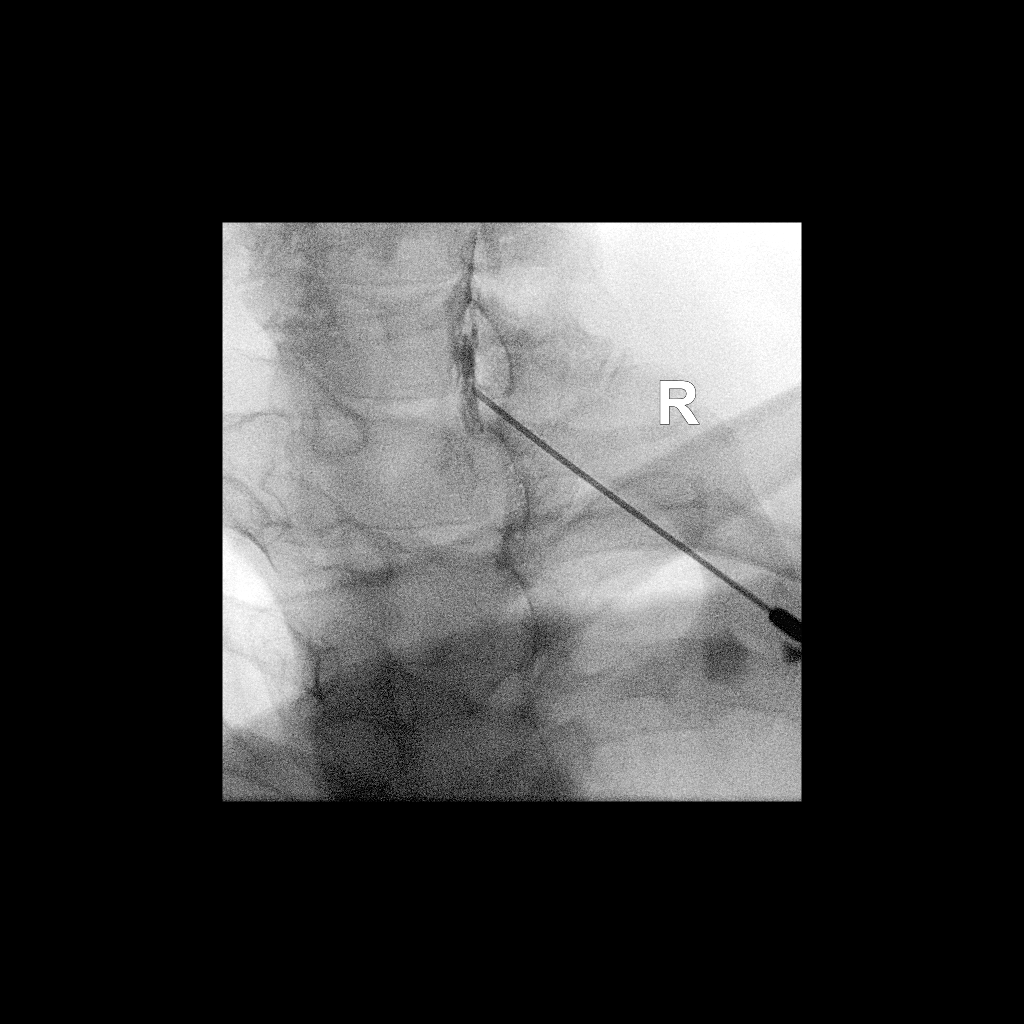

[Series 2: ortho standard · 1 of 1 slices shown (2 of 2)]
[im 1/1]
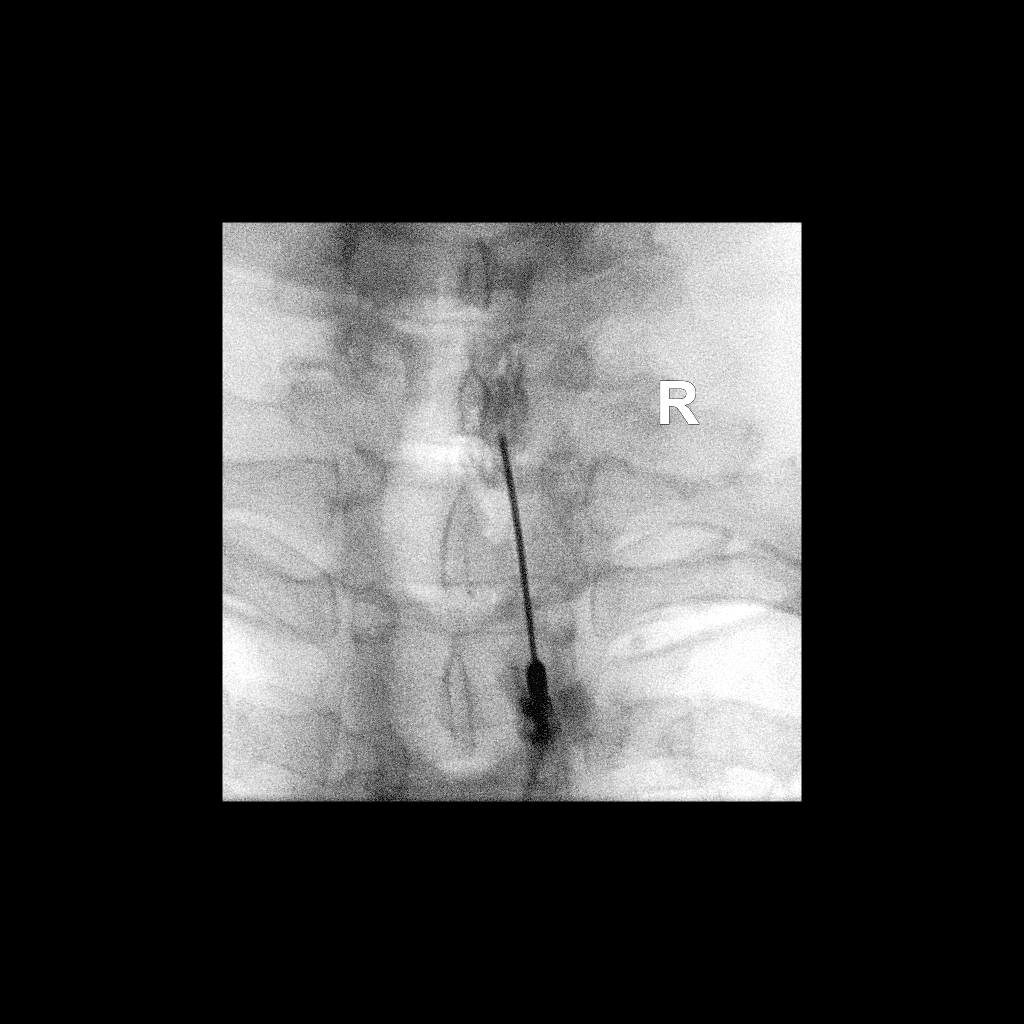

[2 of 2 positions shown; findings below may reference images not displayed]

FLUOROSCOPY TIME:  Radiation Exposure Index (as provided by the
fluoroscopic device): 3.51 uGy*m2

PROCEDURE:
CERVICAL EPIDURAL INJECTION

An interlaminar approach was performed on the right at C7-T1. A 20
gauge epidural needle was advanced using loss-of-resistance
technique.

DIAGNOSTIC EPIDURAL INJECTION

Injection of Isovue-M 300 shows a good epidural pattern with spread
above and below the level of needle placement, primarily on the
right. No vascular opacification is seen. THERAPEUTIC

EPIDURAL INJECTION

1.5 ml of Kenalog 40 mixed with 1 ml of 1% Lidocaine and 2 ml of
normal saline were then instilled. The procedure was well-tolerated,
and the patient was discharged thirty minutes following the
injection in good condition.
IMPRESSION: Technically successful first epidural injection on the right at
C7-T1.

## 2020-05-30 ENCOUNTER — Other Ambulatory Visit: Payer: Self-pay | Admitting: Neurology

## 2020-05-30 ENCOUNTER — Other Ambulatory Visit (HOSPITAL_COMMUNITY): Payer: Self-pay | Admitting: Neurology

## 2020-05-30 DIAGNOSIS — M5416 Radiculopathy, lumbar region: Secondary | ICD-10-CM

## 2020-06-03 ENCOUNTER — Telehealth: Payer: Self-pay | Admitting: Radiology

## 2020-10-07 ENCOUNTER — Other Ambulatory Visit (HOSPITAL_COMMUNITY): Payer: Self-pay | Admitting: Nurse Practitioner

## 2020-10-07 DIAGNOSIS — Z1231 Encounter for screening mammogram for malignant neoplasm of breast: Secondary | ICD-10-CM

## 2021-02-10 ENCOUNTER — Other Ambulatory Visit (HOSPITAL_COMMUNITY): Payer: Self-pay | Admitting: Neurology

## 2021-02-10 DIAGNOSIS — M5416 Radiculopathy, lumbar region: Secondary | ICD-10-CM

## 2021-02-27 ENCOUNTER — Ambulatory Visit (HOSPITAL_COMMUNITY): Payer: 59

## 2021-02-27 ENCOUNTER — Encounter (HOSPITAL_COMMUNITY): Payer: Self-pay

## 2021-03-07 ENCOUNTER — Ambulatory Visit (HOSPITAL_COMMUNITY): Payer: 59

## 2021-04-16 ENCOUNTER — Other Ambulatory Visit (HOSPITAL_COMMUNITY): Payer: Self-pay | Admitting: Nurse Practitioner

## 2021-04-16 DIAGNOSIS — Z1231 Encounter for screening mammogram for malignant neoplasm of breast: Secondary | ICD-10-CM

## 2022-03-26 ENCOUNTER — Other Ambulatory Visit (HOSPITAL_COMMUNITY): Payer: Self-pay | Admitting: Neurology

## 2022-03-26 DIAGNOSIS — M5416 Radiculopathy, lumbar region: Secondary | ICD-10-CM

## 2022-04-07 ENCOUNTER — Other Ambulatory Visit (HOSPITAL_COMMUNITY): Payer: Self-pay | Admitting: Nurse Practitioner

## 2022-04-07 DIAGNOSIS — Z1231 Encounter for screening mammogram for malignant neoplasm of breast: Secondary | ICD-10-CM

## 2022-05-04 ENCOUNTER — Other Ambulatory Visit (HOSPITAL_COMMUNITY): Payer: Self-pay | Admitting: Neurology

## 2022-05-04 DIAGNOSIS — M545 Low back pain, unspecified: Secondary | ICD-10-CM | POA: Diagnosis not present

## 2022-05-04 DIAGNOSIS — Z79891 Long term (current) use of opiate analgesic: Secondary | ICD-10-CM | POA: Diagnosis not present

## 2022-05-04 DIAGNOSIS — M5416 Radiculopathy, lumbar region: Secondary | ICD-10-CM

## 2022-05-07 ENCOUNTER — Other Ambulatory Visit: Payer: Self-pay | Admitting: Neurosurgery

## 2022-05-13 ENCOUNTER — Other Ambulatory Visit (HOSPITAL_COMMUNITY): Payer: Medicare Other

## 2022-05-13 ENCOUNTER — Encounter (HOSPITAL_COMMUNITY): Payer: Self-pay | Admitting: Neurosurgery

## 2022-05-13 NOTE — Progress Notes (Signed)
TWO VISITORS ARE ALLOWED TO COME WITH YOU AND STAY IN THE SURGICAL WAITING ROOM ONLY DURING PRE OP AND PROCEDURE DAY OF SURGERY.   Two VISITORS MAY VISIT WITH YOU AFTER SURGERY IN YOUR PRIVATE ROOM DURING VISITING HOURS ONLY!  PCP - Cheron Every, FNP-C Cardiologist - n/a  Chest x-ray - n/a EKG - DOS Stress Test - n/a ECHO - n/a Cardiac Cath - n/a  ICD Pacemaker/Loop - n/a  Sleep Study -  n/a CPAP - none  ERAS: Clear liquids til 10 AM DOS  STOP now taking any Aspirin (unless otherwise instructed by your surgeon), Aleve, Naproxen, Ibuprofen, Motrin, Advil, Goody's, BC's, all herbal medications, fish oil, and all vitamins.   Coronavirus Screening Do you have any of the following symptoms:  Cough yes/no: No Fever (>100.57F)  yes/no: No Runny nose yes/no: No Sore throat yes/no: No Difficulty breathing/shortness of breath  yes/no: No  Have you traveled in the last 14 days and where? yes/no: No  Patient verbalized understanding of instructions that were given via phone.

## 2022-05-14 ENCOUNTER — Encounter (HOSPITAL_COMMUNITY)
Admission: RE | Disposition: A | Discharge status: Home / Self Care | Payer: Self-pay | Source: Home / Self Care | Attending: Neurosurgery

## 2022-05-14 ENCOUNTER — Ambulatory Visit (HOSPITAL_COMMUNITY): Payer: Medicare Other

## 2022-05-14 ENCOUNTER — Encounter (HOSPITAL_COMMUNITY): Payer: Self-pay | Admitting: Neurosurgery

## 2022-05-14 ENCOUNTER — Other Ambulatory Visit: Payer: Self-pay

## 2022-05-14 ENCOUNTER — Ambulatory Visit (HOSPITAL_COMMUNITY): Payer: Medicare Other | Admitting: Anesthesiology

## 2022-05-14 ENCOUNTER — Ambulatory Visit (HOSPITAL_COMMUNITY)
Admission: RE | Admit: 2022-05-14 | Discharge: 2022-05-14 | Disposition: A | Discharge status: Home / Self Care | Payer: Medicare Other | Attending: Neurosurgery | Admitting: Neurosurgery

## 2022-05-14 ENCOUNTER — Ambulatory Visit (HOSPITAL_BASED_OUTPATIENT_CLINIC_OR_DEPARTMENT_OTHER): Payer: Medicare Other | Admitting: Anesthesiology

## 2022-05-14 DIAGNOSIS — M4312 Spondylolisthesis, cervical region: Secondary | ICD-10-CM | POA: Diagnosis not present

## 2022-05-14 DIAGNOSIS — I10 Essential (primary) hypertension: Secondary | ICD-10-CM | POA: Insufficient documentation

## 2022-05-14 DIAGNOSIS — M47812 Spondylosis without myelopathy or radiculopathy, cervical region: Secondary | ICD-10-CM | POA: Insufficient documentation

## 2022-05-14 DIAGNOSIS — Z87891 Personal history of nicotine dependence: Secondary | ICD-10-CM | POA: Insufficient documentation

## 2022-05-14 HISTORY — PX: ANTERIOR CERVICAL DECOMP/DISCECTOMY FUSION: SHX1161

## 2022-05-14 HISTORY — DX: Unspecified osteoarthritis, unspecified site: M19.90

## 2022-05-14 HISTORY — DX: Anxiety disorder, unspecified: F41.9

## 2022-05-14 HISTORY — DX: Prediabetes: R73.03

## 2022-05-14 HISTORY — DX: Hyperlipidemia, unspecified: E78.5

## 2022-05-14 LAB — CBC
HCT: 40.2 % (ref 36.0–46.0)
Hemoglobin: 13.1 g/dL (ref 12.0–15.0)
MCH: 32.7 pg (ref 26.0–34.0)
MCHC: 32.6 g/dL (ref 30.0–36.0)
MCV: 100.2 fL — ABNORMAL HIGH (ref 80.0–100.0)
Platelets: 239 10*3/uL (ref 150–400)
RBC: 4.01 MIL/uL (ref 3.87–5.11)
RDW: 12.3 % (ref 11.5–15.5)
WBC: 8.5 10*3/uL (ref 4.0–10.5)
nRBC: 0 % (ref 0.0–0.2)

## 2022-05-14 LAB — BASIC METABOLIC PANEL
Anion gap: 6 (ref 5–15)
BUN: 10 mg/dL (ref 8–23)
CO2: 27 mmol/L (ref 22–32)
Calcium: 9.6 mg/dL (ref 8.9–10.3)
Chloride: 108 mmol/L (ref 98–111)
Creatinine, Ser: 0.81 mg/dL (ref 0.44–1.00)
GFR, Estimated: >60 mL/min (ref >60)
Glucose, Bld: 102 mg/dL — ABNORMAL HIGH (ref 70–99)
Potassium: 4.2 mmol/L (ref 3.5–5.1)
Sodium: 141 mmol/L (ref 135–145)

## 2022-05-14 LAB — TYPE AND SCREEN
ABO/RH(D): A POS
Antibody Screen: NEGATIVE

## 2022-05-14 LAB — ABO/RH: ABO/RH(D): A POS

## 2022-05-14 LAB — SURGICAL PCR SCREEN
MRSA, PCR: NEGATIVE
Staphylococcus aureus: POSITIVE — AB

## 2022-05-14 SURGERY — ANTERIOR CERVICAL DECOMPRESSION/DISCECTOMY FUSION 2 LEVELS
Anesthesia: General

## 2022-05-14 MED ORDER — LACTATED RINGERS IV SOLN: INTRAVENOUS | Status: DC

## 2022-05-14 MED ORDER — HYDROMORPHONE HCL 1 MG/ML IJ SOLN
INTRAMUSCULAR | Status: AC
  Filled 2022-05-14: qty 1

## 2022-05-14 MED ORDER — FENTANYL CITRATE (PF) 100 MCG/2ML IJ SOLN
25.0000 ug | INTRAMUSCULAR | Status: DC | PRN
  Administered 2022-05-14 (×3): 50 ug via INTRAVENOUS

## 2022-05-14 MED ORDER — LIDOCAINE-EPINEPHRINE 0.5 %-1:200000 IJ SOLN
INTRAMUSCULAR | Status: DC | PRN
  Administered 2022-05-14: 3 mL

## 2022-05-14 MED ORDER — ROCURONIUM BROMIDE 10 MG/ML (PF) SYRINGE
PREFILLED_SYRINGE | INTRAVENOUS | Status: AC
  Filled 2022-05-14: qty 10

## 2022-05-14 MED ORDER — FENTANYL CITRATE (PF) 250 MCG/5ML IJ SOLN
INTRAMUSCULAR | Status: DC | PRN
  Administered 2022-05-14: 25 ug via INTRAVENOUS
  Administered 2022-05-14 (×2): 50 ug via INTRAVENOUS
  Administered 2022-05-14: 75 ug via INTRAVENOUS
  Administered 2022-05-14: 50 ug via INTRAVENOUS

## 2022-05-14 MED ORDER — THROMBIN 5000 UNITS EX SOLR
CUTANEOUS | Status: AC
  Filled 2022-05-14: qty 10000

## 2022-05-14 MED ORDER — CHLORHEXIDINE GLUCONATE CLOTH 2 % EX PADS: 6.0000 | MEDICATED_PAD | Freq: Once | CUTANEOUS | Status: DC

## 2022-05-14 MED ORDER — 0.9 % SODIUM CHLORIDE (POUR BTL) OPTIME
TOPICAL | Status: DC | PRN
  Administered 2022-05-14: 1000 mL

## 2022-05-14 MED ORDER — HEMOSTATIC AGENTS (NO CHARGE) OPTIME
TOPICAL | Status: DC | PRN
  Administered 2022-05-14: 1 via TOPICAL

## 2022-05-14 MED ORDER — EPHEDRINE 5 MG/ML INJ
INTRAVENOUS | Status: AC
  Filled 2022-05-14: qty 5

## 2022-05-14 MED ORDER — LIDOCAINE 2% (20 MG/ML) 5 ML SYRINGE
INTRAMUSCULAR | Status: AC
  Filled 2022-05-14: qty 5

## 2022-05-14 MED ORDER — ORAL CARE MOUTH RINSE: 15.0000 mL | Freq: Once | OROMUCOSAL | Status: AC

## 2022-05-14 MED ORDER — DIPHENHYDRAMINE HCL 50 MG/ML IJ SOLN
INTRAMUSCULAR | Status: AC
  Filled 2022-05-14: qty 1

## 2022-05-14 MED ORDER — ACETAMINOPHEN 500 MG PO TABS
1000.0000 mg | ORAL_TABLET | Freq: Once | ORAL | Status: AC
  Administered 2022-05-14: 1000 mg via ORAL
  Filled 2022-05-14: qty 2

## 2022-05-14 MED ORDER — LIDOCAINE-EPINEPHRINE 0.5 %-1:200000 IJ SOLN
INTRAMUSCULAR | Status: AC
  Filled 2022-05-14: qty 1

## 2022-05-14 MED ORDER — ONDANSETRON HCL 4 MG/2ML IJ SOLN: 4.0000 mg | Freq: Once | INTRAMUSCULAR | Status: DC | PRN

## 2022-05-14 MED ORDER — DIPHENHYDRAMINE HCL 50 MG/ML IJ SOLN
INTRAMUSCULAR | Status: DC | PRN
  Administered 2022-05-14: 12.5 mg via INTRAVENOUS

## 2022-05-14 MED ORDER — DEXAMETHASONE SODIUM PHOSPHATE 10 MG/ML IJ SOLN
INTRAMUSCULAR | Status: DC | PRN
  Administered 2022-05-14: 10 mg via INTRAVENOUS

## 2022-05-14 MED ORDER — SCOPOLAMINE 1 MG/3DAYS TD PT72
1.0000 | MEDICATED_PATCH | Freq: Once | TRANSDERMAL | Status: DC
  Administered 2022-05-14: 1.5 mg via TRANSDERMAL
  Filled 2022-05-14: qty 1

## 2022-05-14 MED ORDER — CEFAZOLIN SODIUM-DEXTROSE 2-3 GM-%(50ML) IV SOLR
INTRAVENOUS | Status: DC | PRN
  Administered 2022-05-14: 2 g via INTRAVENOUS

## 2022-05-14 MED ORDER — SUGAMMADEX SODIUM 200 MG/2ML IV SOLN
INTRAVENOUS | Status: DC | PRN
  Administered 2022-05-14: 150 mg via INTRAVENOUS

## 2022-05-14 MED ORDER — CYCLOBENZAPRINE HCL 10 MG PO TABS
ORAL_TABLET | ORAL | Status: AC
  Filled 2022-05-14: qty 1

## 2022-05-14 MED ORDER — ONDANSETRON HCL 4 MG/2ML IJ SOLN
INTRAMUSCULAR | Status: DC | PRN
  Administered 2022-05-14: 4 mg via INTRAVENOUS

## 2022-05-14 MED ORDER — PROPOFOL 10 MG/ML IV BOLUS
INTRAVENOUS | Status: DC | PRN
Start: 2022-05-14 — End: 2022-05-14
  Administered 2022-05-14: 130 mg via INTRAVENOUS

## 2022-05-14 MED ORDER — OXYCODONE HCL 5 MG PO TABS
5.0000 mg | ORAL_TABLET | Freq: Once | ORAL | Status: AC
  Administered 2022-05-14: 5 mg via ORAL

## 2022-05-14 MED ORDER — CHLORHEXIDINE GLUCONATE 0.12 % MT SOLN
15.0000 mL | Freq: Once | OROMUCOSAL | Status: AC
  Administered 2022-05-14: 15 mL via OROMUCOSAL
  Filled 2022-05-14: qty 15

## 2022-05-14 MED ORDER — PROPOFOL 10 MG/ML IV BOLUS
INTRAVENOUS | Status: AC
  Filled 2022-05-14: qty 20

## 2022-05-14 MED ORDER — THROMBIN 5000 UNITS EX SOLR
CUTANEOUS | Status: DC | PRN
  Administered 2022-05-14: 10000 [IU] via TOPICAL

## 2022-05-14 MED ORDER — ALBUTEROL SULFATE HFA 108 (90 BASE) MCG/ACT IN AERS
INHALATION_SPRAY | RESPIRATORY_TRACT | Status: AC
  Filled 2022-05-14: qty 6.7

## 2022-05-14 MED ORDER — MIDAZOLAM HCL 2 MG/2ML IJ SOLN
INTRAMUSCULAR | Status: AC
  Filled 2022-05-14: qty 2

## 2022-05-14 MED ORDER — ROCURONIUM BROMIDE 10 MG/ML (PF) SYRINGE
PREFILLED_SYRINGE | INTRAVENOUS | Status: DC | PRN
  Administered 2022-05-14: 10 mg via INTRAVENOUS
  Administered 2022-05-14: 20 mg via INTRAVENOUS
  Administered 2022-05-14: 60 mg via INTRAVENOUS
  Administered 2022-05-14: 10 mg via INTRAVENOUS

## 2022-05-14 MED ORDER — ONDANSETRON HCL 4 MG/2ML IJ SOLN
INTRAMUSCULAR | Status: AC
  Filled 2022-05-14: qty 2

## 2022-05-14 MED ORDER — MIDAZOLAM HCL 5 MG/5ML IJ SOLN
INTRAMUSCULAR | Status: DC | PRN
  Administered 2022-05-14: 2 mg via INTRAVENOUS

## 2022-05-14 MED ORDER — CEFAZOLIN SODIUM-DEXTROSE 2-4 GM/100ML-% IV SOLN
INTRAVENOUS | Status: AC
  Filled 2022-05-14: qty 100

## 2022-05-14 MED ORDER — EPHEDRINE SULFATE-NACL 50-0.9 MG/10ML-% IV SOSY
PREFILLED_SYRINGE | INTRAVENOUS | Status: DC | PRN
  Administered 2022-05-14: 10 mg via INTRAVENOUS
  Administered 2022-05-14: 5 mg via INTRAVENOUS

## 2022-05-14 MED ORDER — CEFAZOLIN SODIUM-DEXTROSE 2-4 GM/100ML-% IV SOLN: 2.0000 g | INTRAVENOUS | Status: DC

## 2022-05-14 MED ORDER — PHENYLEPHRINE 80 MCG/ML (10ML) SYRINGE FOR IV PUSH (FOR BLOOD PRESSURE SUPPORT)
PREFILLED_SYRINGE | INTRAVENOUS | Status: DC | PRN
Start: 2022-05-14 — End: 2022-05-14
  Administered 2022-05-14: 80 ug via INTRAVENOUS

## 2022-05-14 MED ORDER — OXYCODONE HCL 5 MG PO TABS
ORAL_TABLET | ORAL | Status: AC
  Filled 2022-05-14: qty 1

## 2022-05-14 MED ORDER — LIDOCAINE 2% (20 MG/ML) 5 ML SYRINGE
INTRAMUSCULAR | Status: DC | PRN
  Administered 2022-05-14: 80 mg via INTRAVENOUS

## 2022-05-14 MED ORDER — HYDROMORPHONE HCL 1 MG/ML IJ SOLN
0.5000 mg | INTRAMUSCULAR | Status: AC | PRN
  Administered 2022-05-14 (×4): 0.5 mg via INTRAVENOUS

## 2022-05-14 MED ORDER — CYCLOBENZAPRINE HCL 10 MG PO TABS
10.0000 mg | ORAL_TABLET | Freq: Once | ORAL | Status: AC
  Administered 2022-05-14: 10 mg via ORAL

## 2022-05-14 MED ORDER — DEXAMETHASONE SODIUM PHOSPHATE 10 MG/ML IJ SOLN
INTRAMUSCULAR | Status: AC
  Filled 2022-05-14: qty 1

## 2022-05-14 MED ORDER — FENTANYL CITRATE (PF) 100 MCG/2ML IJ SOLN
INTRAMUSCULAR | Status: AC
  Filled 2022-05-14: qty 2

## 2022-05-14 MED ORDER — FENTANYL CITRATE (PF) 250 MCG/5ML IJ SOLN
INTRAMUSCULAR | Status: AC
  Filled 2022-05-14: qty 5

## 2022-05-14 MED ORDER — ALBUTEROL SULFATE HFA 108 (90 BASE) MCG/ACT IN AERS
INHALATION_SPRAY | RESPIRATORY_TRACT | Status: DC | PRN
  Administered 2022-05-14: 2 via RESPIRATORY_TRACT

## 2022-05-14 MED ORDER — PHENYLEPHRINE 80 MCG/ML (10ML) SYRINGE FOR IV PUSH (FOR BLOOD PRESSURE SUPPORT)
PREFILLED_SYRINGE | INTRAVENOUS | Status: AC
  Filled 2022-05-14: qty 10

## 2022-05-14 SURGICAL SUPPLY — 47 items
ALLOGRAFT 7X14X11 (Bone Implant) ×1 IMPLANT
BAG COUNTER SPONGE SURGICOUNT (BAG) ×2 IMPLANT
BAND RUBBER #18 3X1/16 STRL (MISCELLANEOUS) ×4 IMPLANT
BUR DRUM 4.0 (BURR) ×2 IMPLANT
BUR MATCHSTICK NEURO 3.0 LAGG (BURR) ×2 IMPLANT
CANISTER SUCT 3000ML PPV (MISCELLANEOUS) ×2 IMPLANT
CARTRIDGE OIL MAESTRO DRILL (MISCELLANEOUS) ×1 IMPLANT
DECANTER SPIKE VIAL GLASS SM (MISCELLANEOUS) ×2 IMPLANT
DERMABOND ADVANCED (GAUZE/BANDAGES/DRESSINGS) ×1
DERMABOND ADVANCED .7 DNX12 (GAUZE/BANDAGES/DRESSINGS) ×1 IMPLANT
DIFFUSER DRILL AIR PNEUMATIC (MISCELLANEOUS) ×2 IMPLANT
DRAPE HALF SHEET 40X57 (DRAPES) IMPLANT
DRAPE LAPAROTOMY 100X72 PEDS (DRAPES) ×2 IMPLANT
DRAPE MICROSCOPE LEICA (MISCELLANEOUS) ×2 IMPLANT
DURAPREP 6ML APPLICATOR 50/CS (WOUND CARE) ×2 IMPLANT
ELECT COATED BLADE 2.86 ST (ELECTRODE) ×2 IMPLANT
ELECT REM PT RETURN 9FT ADLT (ELECTROSURGICAL) ×2
ELECTRODE REM PT RTRN 9FT ADLT (ELECTROSURGICAL) ×1 IMPLANT
GAUZE 4X4 16PLY ~~LOC~~+RFID DBL (SPONGE) IMPLANT
GLOVE ECLIPSE 6.5 STRL STRAW (GLOVE) ×2 IMPLANT
GLOVE EXAM NITRILE XL STR (GLOVE) IMPLANT
GOWN STRL REUS W/ TWL LRG LVL3 (GOWN DISPOSABLE) ×3 IMPLANT
GOWN STRL REUS W/ TWL XL LVL3 (GOWN DISPOSABLE) IMPLANT
GOWN STRL REUS W/TWL 2XL LVL3 (GOWN DISPOSABLE) IMPLANT
GOWN STRL REUS W/TWL LRG LVL3 (GOWN DISPOSABLE) ×3
GOWN STRL REUS W/TWL XL LVL3 (GOWN DISPOSABLE)
GRAFT CORT CANC 14X8.25X11 5D (Bone Implant) ×1 IMPLANT
KIT BASIN OR (CUSTOM PROCEDURE TRAY) ×2 IMPLANT
KIT TURNOVER KIT B (KITS) ×2 IMPLANT
NDL HYPO 25X1 1.5 SAFETY (NEEDLE) ×1 IMPLANT
NDL SPNL 22GX3.5 QUINCKE BK (NEEDLE) ×1 IMPLANT
NEEDLE HYPO 25X1 1.5 SAFETY (NEEDLE) ×2 IMPLANT
NEEDLE SPNL 22GX3.5 QUINCKE BK (NEEDLE) ×2 IMPLANT
NS IRRIG 1000ML POUR BTL (IV SOLUTION) ×2 IMPLANT
OIL CARTRIDGE MAESTRO DRILL (MISCELLANEOUS) ×2
PACK LAMINECTOMY NEURO (CUSTOM PROCEDURE TRAY) ×2 IMPLANT
PAD ARMBOARD 7.5X6 YLW CONV (MISCELLANEOUS) ×6 IMPLANT
PLATE ACP 1.6X36 2LVL (Plate) ×1 IMPLANT
SCREW ACP VA ST 3.5X15 (Screw) ×6 IMPLANT
SPONGE INTESTINAL PEANUT (DISPOSABLE) ×2 IMPLANT
SPONGE SURGIFOAM ABS GEL SZ50 (HEMOSTASIS) ×2 IMPLANT
SUT VIC AB 0 CT1 27 (SUTURE)
SUT VIC AB 0 CT1 27XBRD ANTBC (SUTURE) IMPLANT
SUT VIC AB 3-0 SH 8-18 (SUTURE) ×2 IMPLANT
TOWEL GREEN STERILE (TOWEL DISPOSABLE) ×2 IMPLANT
TOWEL GREEN STERILE FF (TOWEL DISPOSABLE) ×2 IMPLANT
WATER STERILE IRR 1000ML POUR (IV SOLUTION) ×2 IMPLANT

## 2022-05-14 NOTE — Op Note (Signed)
05/14/2022  5:32 PM  PATIENT:  Kelly [name]  66 y.o. female  PRE-OPERATIVE DIAGNOSIS:  Spondylolisthesis, Cervical region 4/5, spondylosis C5/6  POST-OPERATIVE DIAGNOSIS:  Spondylolisthesis, Cervical region 4/5, spondylosis C5/6  PROCEDURE:  Anterior Cervical decompression C4/5,5/6 Arthrodesis C4-6 with 96mm (C4/5) 58mm C5/6) structural allografts Anterior instrumentation(Nuvasive ACP) C4-6  SURGEON:   Surgeon(s): Coletta Memos, MD Dawley, Alan Mulder, DO   ASSISTANTS:Dawley, Kendell Bane  ANESTHESIA:   general  EBL:  Total I/O In: 1500 [I.V.:1400; IV Piggyback:100] Out: 25 [Blood:25]  BLOOD ADMINISTERED:none  CELL SAVER GIVEN:none  COUNT:per nursing  DRAINS: none   SPECIMEN:  No Specimen  DICTATION: [name] was taken to the operating room, intubated, and placed under general anesthesia without difficulty. She was positioned supine with her head in slight extension on a horseshoe headrest. The neck was prepped and draped in a sterile manner. I infiltrated 5 cc's 1/2%lidocaine/1:200,000 strength epinephrine into the planned incision starting from the midline to the medial border of the left sternocleidomastoid muscle. I opened the incision with a 10 blade and dissected sharply through soft tissue to the platysma. I dissected in the plane superior to the platysma both rostrally and caudally. I then opened the platysma in a horizontal fashion with Metzenbaum scissors, and dissected in the inferior plane rostrally and caudally. With both blunt and sharp technique I created an avascular corridor to the cervical spine. I placed a spinal needle(s) in the disc space at C4/5 . I then reflected the longus colli from C4 to C6 and placed self retaining retractors. I opened the disc space(s) at C4/5,5/6 with a 15 blade. I removed disc with curettes, Kerrison punches, and the drill. Using the drill I removed osteophytes and prepared for the decompression.  I decompressed the spinal canal and the C5,6  root(s) with the drill, Kerrison punches, and the curettes. I used the microscope to aid in microdissection. I removed the posterior longitudinal ligament to fully expose and decompress the thecal sac. I exposed the roots laterally taking down the 4/5,5/6 uncovertebral joints. With the decompression complete We moved on to the arthrodesis. We used the drill to level the surfaces of C4,5,and 6. I removed soft tissue to prepare the disc space and the bony surfaces. I measured the space and placed a 19mm structural allograft into the disc space at 5/6, and 77mm at C4/5  We then placed the anterior instrumentation. I placed 2 screws in each vertebral body through the plate. I locked the screws into place. Intraoperative xray showed the graft, plate, and screws to be in good position. I irrigated the wound, achieved hemostasis, and closed the wound in layers. I approximated the platysma, and the subcuticular plane with vicryl sutures. I used Dermabond for a sterile dressing.   PLAN OF CARE: Discharge to home after PACU  PATIENT DISPOSITION:  PACU - hemodynamically stable.   Delay start of Pharmacological VTE agent (>24hrs) due to surgical blood loss or risk of bleeding:  no

## 2022-05-14 NOTE — Transfer of Care (Signed)
Immediate Anesthesia Transfer of Care Note  Patient: Kelly [name]  Procedure(s) Performed: C4-5, C5-6 ACDF  Patient Location: PACU  Anesthesia Type:General  Level of Consciousness: oriented, drowsy and patient cooperative  Airway & Oxygen Therapy: Patient Spontanous Breathing and Patient connected to nasal cannula oxygen  Post-op Assessment: Report given to RN and Post -op Vital signs reviewed and stable  Post vital signs: Reviewed  Last Vitals:  Vitals Value Taken Time  BP 121/58 05/14/22 1725  Temp    Pulse 72 05/14/22 1727  Resp 15 05/14/22 1727  SpO2 96 % 05/14/22 1727  Vitals shown include unvalidated device data.  Last Pain:  Vitals:   05/14/22 1057  PainSc: 6       Patients Stated Pain Goal: 1 (05/14/22 1057)  Complications: No notable events documented.

## 2022-05-14 NOTE — Anesthesia Procedure Notes (Signed)
Procedure Name: Intubation Date/Time: 05/14/2022 2:40 PM Performed by: Lovie Chol, CRNA Pre-anesthesia Checklist: Patient identified, Emergency Drugs available, Suction available and Patient being monitored Patient Re-evaluated:Patient Re-evaluated prior to induction Oxygen Delivery Method: Circle System Utilized Preoxygenation: Pre-oxygenation with 100% oxygen Induction Type: IV induction Ventilation: Mask ventilation without difficulty Laryngoscope Size: Glidescope and 3 Grade View: Grade I Tube type: Oral Tube size: 7.0 mm Number of attempts: 1 Airway Equipment and Method: Stylet and Oral airway Placement Confirmation: ETT inserted through vocal cords under direct vision, positive ETCO2 and breath sounds checked- equal and bilateral Secured at: 21 cm Tube secured with: Tape Dental Injury: Teeth and Oropharynx as per pre-operative assessment

## 2022-05-14 NOTE — Discharge Instructions (Addendum)

## 2022-05-14 NOTE — H&P (Signed)
BP (!) 99/59   Pulse (!) 54   Temp 98.1 F (36.7 C)   Resp 18   Ht 5\' 6"  (1.676 m)   Wt 62.1 kg   SpO2 98%   BMI 22.11 kg/m   Kelly [name] returns today.  I have not seen her since 2020 but she has been part of this group because she has been getting injections done by the pain side during this entire time.  She returns today stating that she just needs surgery at this point.  At the time of the last MRI in 2020, she had a listhesis at 4-5 and significant degeneration at 5-6 and 6-7 and she had severe foraminal narrowing at each and every level, those 3 levels 4-5, 5-6, 6-7.  Obviously at this point, as she is contemplating surgery once more, I will need to repeat the studies, and I will also repeat the cervical spine x-ray.     On exam, she has some mild weakness in the left grip 5- to 4+ over 5.  Otherwise, the strength is 5/5 in every muscle group.  Reflexes are 2+ in the upper and lower extremities.  Gait is normal.  Romberg is negative.  Muscle tone is also normal.  Pupils equal, round, and reactive to light.  Full extraocular movements.  Speech is clear and fluent.       I will have her undergo repeat MRI, repeat spine films with flexion-extension views.  Have her come back and then we can speak about doing an ACDF at that time.  Vital signs:  Height 5 feet 5 inches.  She weighs 136 pounds.  STORY OF PRESENT ILLNESS :  She returns with an MRI of the cervical spine.  She has a listhesis present at C4-5 and severe bilateral foraminal narrowing.  Minimal canal stenosis and at C5-6 she also has fairly significant bilateral foraminal narrowing.  Degenerative changes within the disc space, as is present at C4-5 and some facet arthropathy.  I believe these two levels are the chief reason for the pain that she is experiencing in the cervical region and right upper extremity.  I have recommended, and she has agreed, to undergo operative decompression at C4-5 and C5-6 with subsequent arthrodesis  using hardware and bony implants.

## 2022-05-14 NOTE — Anesthesia Preprocedure Evaluation (Addendum)
Anesthesia Evaluation  Patient identified by MRN, date of birth, ID band Patient awake    Reviewed: Allergy & Precautions, NPO status , Patient's Chart, lab work & pertinent test results, reviewed documented beta blocker date and time   Airway Mallampati: III  TM Distance: >3 FB Neck ROM: Limited    Dental  (+) Dental Advisory Given, Upper Dentures, Poor Dentition, Missing   Pulmonary former smoker,    + rhonchi        Cardiovascular hypertension, Pt. on home beta blockers and Pt. on medications Normal cardiovascular exam Rhythm:Regular Rate:Normal     Neuro/Psych PSYCHIATRIC DISORDERS Anxiety Spondylolisthesis, Cervical region    GI/Hepatic negative GI ROS, Neg liver ROS,   Endo/Other  negative endocrine ROS  Renal/GU negative Renal ROS     Musculoskeletal  (+) Arthritis ,   Abdominal   Peds  Hematology negative hematology ROS (+)   Anesthesia Other Findings Day of surgery medications reviewed with the patient.  Reproductive/Obstetrics                            Anesthesia Physical Anesthesia Plan  ASA: 2  Anesthesia Plan: General   Post-op Pain Management:    Induction: Intravenous  PONV Risk Score and Plan: 3 and Midazolam, Dexamethasone, Ondansetron, Scopolamine patch - Pre-op and Diphenhydramine  Airway Management Planned: Oral ETT and Video Laryngoscope Planned  Additional Equipment:   Intra-op Plan:   Post-operative Plan: Extubation in OR  Informed Consent: I have reviewed the patients History and Physical, chart, labs and discussed the procedure including the risks, benefits and alternatives for the proposed anesthesia with the patient or authorized representative who has indicated his/her understanding and acceptance.     Dental advisory given  Plan Discussed with: CRNA  Anesthesia Plan Comments:        Anesthesia Quick Evaluation

## 2022-05-15 ENCOUNTER — Encounter (HOSPITAL_COMMUNITY): Payer: Self-pay | Admitting: Neurosurgery

## 2022-05-15 NOTE — Anesthesia Postprocedure Evaluation (Signed)
Anesthesia Post Note  Patient: Kelly [name]  Procedure(s) Performed: C4-5, C5-6 ACDF     Patient location during evaluation: PACU Anesthesia Type: General Level of consciousness: awake and alert Pain management: pain level controlled Vital Signs Assessment: post-procedure vital signs reviewed and stable Respiratory status: spontaneous breathing, nonlabored ventilation, respiratory function stable and patient connected to nasal cannula oxygen Cardiovascular status: blood pressure returned to baseline and stable Postop Assessment: no apparent nausea or vomiting Anesthetic complications: no   No notable events documented.  Last Vitals:  Vitals:   05/14/22 1840 05/14/22 1900  BP: (!) 108/46   Pulse: 66 76  Resp: 13   Temp: (!) 36.3 C   SpO2: 99% 93%    Last Pain:  Vitals:   05/14/22 1825  PainSc: Hagerman Shameka Aggarwal

## 2022-08-18 ENCOUNTER — Encounter (HOSPITAL_COMMUNITY): Payer: Self-pay

## 2022-08-18 ENCOUNTER — Ambulatory Visit (HOSPITAL_COMMUNITY): Payer: Medicare Other | Attending: Neurology

## 2022-10-02 ENCOUNTER — Ambulatory Visit (HOSPITAL_COMMUNITY)
Admission: RE | Admit: 2022-10-02 | Discharge: 2022-10-02 | Disposition: A | Discharge status: Home / Self Care | Payer: Medicare Other | Source: Ambulatory Visit | Attending: Neurology | Admitting: Neurology

## 2022-10-02 DIAGNOSIS — M5416 Radiculopathy, lumbar region: Secondary | ICD-10-CM | POA: Diagnosis present

## 2023-04-03 ENCOUNTER — Emergency Department (HOSPITAL_COMMUNITY): Payer: Medicare Other

## 2023-04-03 ENCOUNTER — Encounter (HOSPITAL_COMMUNITY): Payer: Self-pay

## 2023-04-03 ENCOUNTER — Emergency Department (HOSPITAL_COMMUNITY)
Admission: EM | Admit: 2023-04-03 | Discharge: 2023-04-03 | Disposition: A | Discharge status: Home / Self Care | Payer: Medicare Other | Attending: Emergency Medicine | Admitting: Emergency Medicine

## 2023-04-03 ENCOUNTER — Other Ambulatory Visit: Payer: Self-pay

## 2023-04-03 DIAGNOSIS — M25512 Pain in left shoulder: Secondary | ICD-10-CM | POA: Diagnosis present

## 2023-04-03 DIAGNOSIS — W109XXA Fall (on) (from) unspecified stairs and steps, initial encounter: Secondary | ICD-10-CM | POA: Diagnosis not present

## 2023-04-03 DIAGNOSIS — M542 Cervicalgia: Secondary | ICD-10-CM | POA: Diagnosis not present

## 2023-04-03 DIAGNOSIS — M25552 Pain in left hip: Secondary | ICD-10-CM | POA: Insufficient documentation

## 2023-04-03 DIAGNOSIS — S40212A Abrasion of left shoulder, initial encounter: Secondary | ICD-10-CM | POA: Insufficient documentation

## 2023-04-03 DIAGNOSIS — Y92009 Unspecified place in unspecified non-institutional (private) residence as the place of occurrence of the external cause: Secondary | ICD-10-CM | POA: Diagnosis not present

## 2023-04-03 DIAGNOSIS — W19XXXA Unspecified fall, initial encounter: Secondary | ICD-10-CM

## 2023-04-03 DIAGNOSIS — S0990XA Unspecified injury of head, initial encounter: Secondary | ICD-10-CM | POA: Diagnosis not present

## 2023-04-03 NOTE — ED Triage Notes (Signed)
Pt arrived via POV from home c/o of pain and injury following a mechanical fall. Pt reports she went to grab a baby gate and the gate fell and the Pt fell down apprx 4 steps. Pt denies LOC. Pt endorses left hip, rib, shoulder and head pain. Minor skin tear to left shoulder.

## 2023-04-03 NOTE — Discharge Instructions (Signed)
Return to the ED with any new or worsening signs or symptoms such as lethargy, nausea or vomiting Please follow-up with your PCP for reevaluation this week if you feel the need to do so You may continue taking ibuprofen every 6 hours as needed for pain Please read attached guide concerning head injuries

## 2023-04-03 NOTE — ED Notes (Signed)
ED Provider at bedside. 

## 2023-04-03 NOTE — ED Notes (Signed)
Pt reports taking 1000mg  of Tylenol and 800mg  Ibuprofen PTA.

## 2023-04-03 NOTE — ED Provider Notes (Signed)
Kelly [name]   CSN: [id number] Arrival date & time: 04/03/23  2023     History  Chief Complaint  Patient presents with   Marletta Lor    Kelly [name] is a 67 y.o. female with medical history of anemia, anxiety, arthritis, diverticulitis, hypertension.  Patient presents to the ED for evaluation of fall.  Patient states that prior to arrival she was attempting to close a baby gate when she fell backwards down 4 steps of stairs.  Patient states that she thought that the baby gate was secured however when she went to pull on it it gave causing her to fall backwards.  Patient reports that she hit the back of her head on concrete.  Patient denies losing consciousness, she denies taking blood thinners.  The patient is not complaining of left-sided shoulder pain, left-sided hip pain, neck pain, headache.  Patient reports that she has a history of neck pain however this neck pain is "new" neck pain.  Patient denies any preceding chest pain or shortness of breath prior to the fall.  Patient denies any lightheadedness, dizziness, weakness, nausea or vomiting, photophobia.  She denies back pain.   Fall Associated symptoms include headaches.       Home Medications Prior to Admission medications   Medication Sig Start Date End Date Taking? Authorizing Provider  Biotin 5000 MCG CAPS Take 5,000 mcg by mouth daily.    [provider]  Cholecalciferol (VITAMIN D3) 125 MCG (5000 UT) CAPS Take 5,000 Units by mouth daily.    [provider]  cyclobenzaprine (FLEXERIL) 10 MG tablet Take 10 mg by mouth 3 (three) times daily as needed for muscle spasms. 04/24/22   [provider]  DULoxetine (CYMBALTA) 60 MG capsule Take 120 mg by mouth daily. 05/11/22   [provider]  Flaxseed, Linseed, (FLAXSEED OIL) 1000 MG CAPS Take 1,000 mg by mouth in the morning, at noon, and at bedtime.    [provider]  gabapentin  (NEURONTIN) 800 MG tablet Take 800 mg by mouth 3 (three) times daily. 01/01/15   [provider]  LORazepam (ATIVAN) 1 MG tablet Take 1 mg by mouth 2 (two) times daily as needed for anxiety or sleep.  01/02/15   [provider]  metoprolol tartrate (LOPRESSOR) 25 MG tablet Take 25 mg by mouth 2 (two) times daily. 12/12/14   [provider]  oxyCODONE-acetaminophen (PERCOCET) 10-325 MG tablet Take 1 tablet by mouth every 6 (six) hours as needed for pain. 04/15/22   [provider]  simvastatin (ZOCOR) 20 MG tablet Take 20 mg by mouth at bedtime. 05/09/22   [provider]  spironolactone (ALDACTONE) 25 MG tablet Take 25 mg by mouth daily. 12/22/14   [provider]      Allergies    Bee venom    Review of Systems   Review of Systems  Musculoskeletal:  Positive for arthralgias and neck pain.  Neurological:  Positive for headaches. Negative for syncope.  All other systems reviewed and are negative.   Physical Exam Updated Vital Signs BP 116/73   Pulse 67   Temp 97.9 F (36.6 C) (Oral)   Resp 18   Ht 5\' 6"  (1.676 m)   Wt 60.3 kg   SpO2 96%   BMI 21.47 kg/m  Physical Exam Vitals and nursing [name] reviewed.  Constitutional:      General: She is not in acute distress.    Appearance: Normal  appearance. She is not ill-appearing, toxic-appearing or diaphoretic.  HENT:     Head: Normocephalic and atraumatic.     Nose: Nose normal. No congestion.     Mouth/Throat:     Mouth: Mucous membranes are moist.     Pharynx: Oropharynx is clear.  Eyes:     Extraocular Movements: Extraocular movements intact.     Conjunctiva/sclera: Conjunctivae normal.     Pupils: Pupils are equal, round, and reactive to light.  Neck:     Comments: Left sided paracervical tenderness, no midline tenderness Cardiovascular:     Rate and Rhythm: Normal rate and regular rhythm.  Pulmonary:     Effort: Pulmonary effort is normal.     Comments: Distant lung  sounds Abdominal:     General: Abdomen is flat. Bowel sounds are normal.     Palpations: Abdomen is soft.     Tenderness: There is no abdominal tenderness.  Musculoskeletal:     Left shoulder: Tenderness present. No swelling, deformity or effusion. Normal range of motion.     Cervical back: Normal range of motion and neck supple. Tenderness present.     Left hip: Tenderness present. Normal range of motion.     Comments: Abrasion to patient left shoulder.  Patient has full range of motion of left shoulder.  No obvious deformity.  Nonfocal tenderness to hip.  Full range of motion of hip appreciated.  No shortening or rotation of left lower extremity  Skin:    General: Skin is warm and dry.     Capillary Refill: Capillary refill takes less than 2 seconds.  Neurological:     General: No focal deficit present.     Mental Status: She is alert and oriented to person, place, and time.     GCS: GCS eye subscore is 4. GCS verbal subscore is 5. GCS motor subscore is 6.     Cranial Nerves: Cranial nerves 2-12 are intact. No cranial nerve deficit.     Sensory: Sensation is intact. No sensory deficit.     Motor: Motor function is intact. No weakness.     Coordination: Coordination is intact. Heel to [facility] Test normal.     Comments: Cranial nerves II through XII intact.  No pronator drift, no slurred speech, no facial droop.  Intact finger-nose, heel-to-shin.  5 out of 5 strength bilateral lower extremities     ED Results / Procedures / Treatments   Labs (all labs ordered are listed, but only abnormal results are displayed) Labs Reviewed - No data to display  EKG None  Radiology CT Head Wo Contrast  Result Date: 04/03/2023 CLINICAL DATA:  Head and neck trauma, fall. EXAM: CT HEAD WITHOUT CONTRAST CT CERVICAL SPINE WITHOUT CONTRAST TECHNIQUE: Multidetector CT imaging of the head and cervical spine was performed following the standard protocol without intravenous contrast. Multiplanar CT image  reconstructions of the cervical spine were also generated. RADIATION DOSE REDUCTION: This exam was performed according to the departmental dose-optimization program which includes automated exposure control, adjustment of the mA and/or kV according to patient size and/or use of iterative reconstruction technique. COMPARISON:  05/07/2022. FINDINGS: CT HEAD FINDINGS Brain: No acute intracranial hemorrhage, midline shift or mass effect. No extra-axial fluid collection. Gray-white matter differentiation is within normal limits. No hydrocephalus. Vascular: No hyperdense vessel or unexpected calcification. Skull: Normal. Negative for fracture or focal lesion. Sinuses/Orbits: No acute finding. Other: None. CT CERVICAL SPINE FINDINGS Alignment: There is mild anterolisthesis at C4-C5, unchanged from the previous exam. Skull  base and vertebrae: No acute fracture. Anterior cervical spinal fusion hardware is noted from C4-C6. There is no evidence of hardware loosening. Soft tissues and spinal canal: No prevertebral fluid or swelling. No visible canal hematoma. Disc levels: Moderate to severe uncovertebral osteophyte formation and bilateral facet arthropathy. Upper chest: No acute abnormality. Other: None. IMPRESSION: 1. No acute intracranial process. 2. 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 No acute osseous abnormality is seen. Electronically Signed   By: Thornell Sartorius M.D.   On: 04/03/2023 21:30   CT Cervical Spine Wo Contrast  Result Date: 04/03/2023 CLINICAL DATA:  Head and neck trauma, fall. EXAM: CT HEAD WITHOUT CONTRAST CT CERVICAL SPINE WITHOUT CONTRAST TECHNIQUE: Multidetector CT imaging of the head and cervical spine was performed following the standard protocol without intravenous contrast. Multiplanar CT image reconstructions of the cervical spine were also generated. RADIATION DOSE REDUCTION: This exam was performed according to the departmental  dose-optimization program which includes automated exposure control, adjustment of the mA and/or kV according to patient size and/or use of iterative reconstruction technique. COMPARISON:  05/07/2022. FINDINGS: CT HEAD FINDINGS Brain: No acute intracranial hemorrhage, midline shift or mass effect. No extra-axial fluid collection. Gray-white matter differentiation is within normal limits. No hydrocephalus. Vascular: No hyperdense vessel or unexpected calcification. Skull: Normal. Negative for fracture or focal lesion. Sinuses/Orbits: No acute finding. Other: None. CT CERVICAL SPINE FINDINGS Alignment: There is mild anterolisthesis at C4-C5, unchanged from the previous exam. Skull base and vertebrae: No acute fracture. Anterior cervical spinal fusion hardware is noted from C4-C6. There is no evidence of hardware loosening. Soft tissues and spinal canal: No prevertebral fluid or swelling. No visible canal hematoma. Disc levels: Moderate to severe uncovertebral osteophyte formation and bilateral facet arthropathy. Upper chest: No acute abnormality. Other: None. IMPRESSION: 1. No acute intracranial process. 2. 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 No acute osseous abnormality is seen. Electronically Signed   By: Thornell Sartorius M.D.   On: 04/03/2023 21:30   DG Hip Unilat W or Wo Pelvis 2-3 Views Left  Result Date: 04/03/2023 CLINICAL DATA:  Status post fall down 4 steps landed on left hip now has pain without shortening or rotation. Able to bear weight. EXAM: DG HIP (WITH OR WITHOUT PELVIS) 2-3V LEFT COMPARISON:  None Available. FINDINGS: There is no evidence of hip fracture or dislocation. There is no evidence of arthropathy or other focal bone abnormality. IMPRESSION: Negative. Electronically Signed   By: Minerva Fester M.D.   On: 04/03/2023 21:10    Procedures Procedures   Medications Ordered in ED Medications - No data to display  ED Course/  Medical Decision Making/ A&P  Medical Decision Making Amount and/or Complexity of Data Reviewed Radiology: ordered.   67 year old female presents to the ED for evaluation.  Please see HPI for further details.  On examination the patient is afebrile and nontachycardic.  Lung sounds are clear bilaterally, not hypoxic.  Abdomen soft and compressible throughout.  Neurological examination without focal neurodeficits.  Patient has no external signs of trauma to her head.  Patient able to ambulate with steady gait around hospital room so low concern for hip fracture of left side.  There was mention of skin tear in triage [name] however patient only has superficial abrasion to left shoulder, no skin tear.  Patient imaging includes CT head, CT cervical spine, plain film imaging of left hip.  CT head, CT cervical  spine unremarkable for intracranial pathology or traumatic listhesis of C-spine.  Plain film imaging of patient left hip shows no dislocation or fracture.  The patient was again able to show steady gait and ambulate around room without assistance.  At this time the patient we discharged home and advised to follow-up with her PCP for reevaluation if she feels the need to do so.  Patient will be advised to take ibuprofen for pain at home.  Patient counseled on return precautions and she voiced understanding.  Patient had all her questions answered to her satisfaction.  Patient stable for discharge.   Final Clinical Impression(s) / ED Diagnoses Final diagnoses:  Fall, initial encounter  Injury of head, initial encounter    Rx / DC Orders ED Discharge Orders     None         Clent Ridges 04/03/23 2145    Eber Hong, MD 04/05/23 2029

## 2023-04-03 NOTE — ED Notes (Signed)
Pt left without d/c papers

## 2023-12-25 IMAGING — CR DG CERVICAL SPINE 2 OR 3 VIEWS
3 series · 3 of 3 positions shown · non-contrast
Comparison: Cervical radiograph 04/28/2022

CLINICAL DATA: Surgical localization

EXAM:
CERVICAL SPINE - 2-3 VIEW

[xtable (1 of 3)]
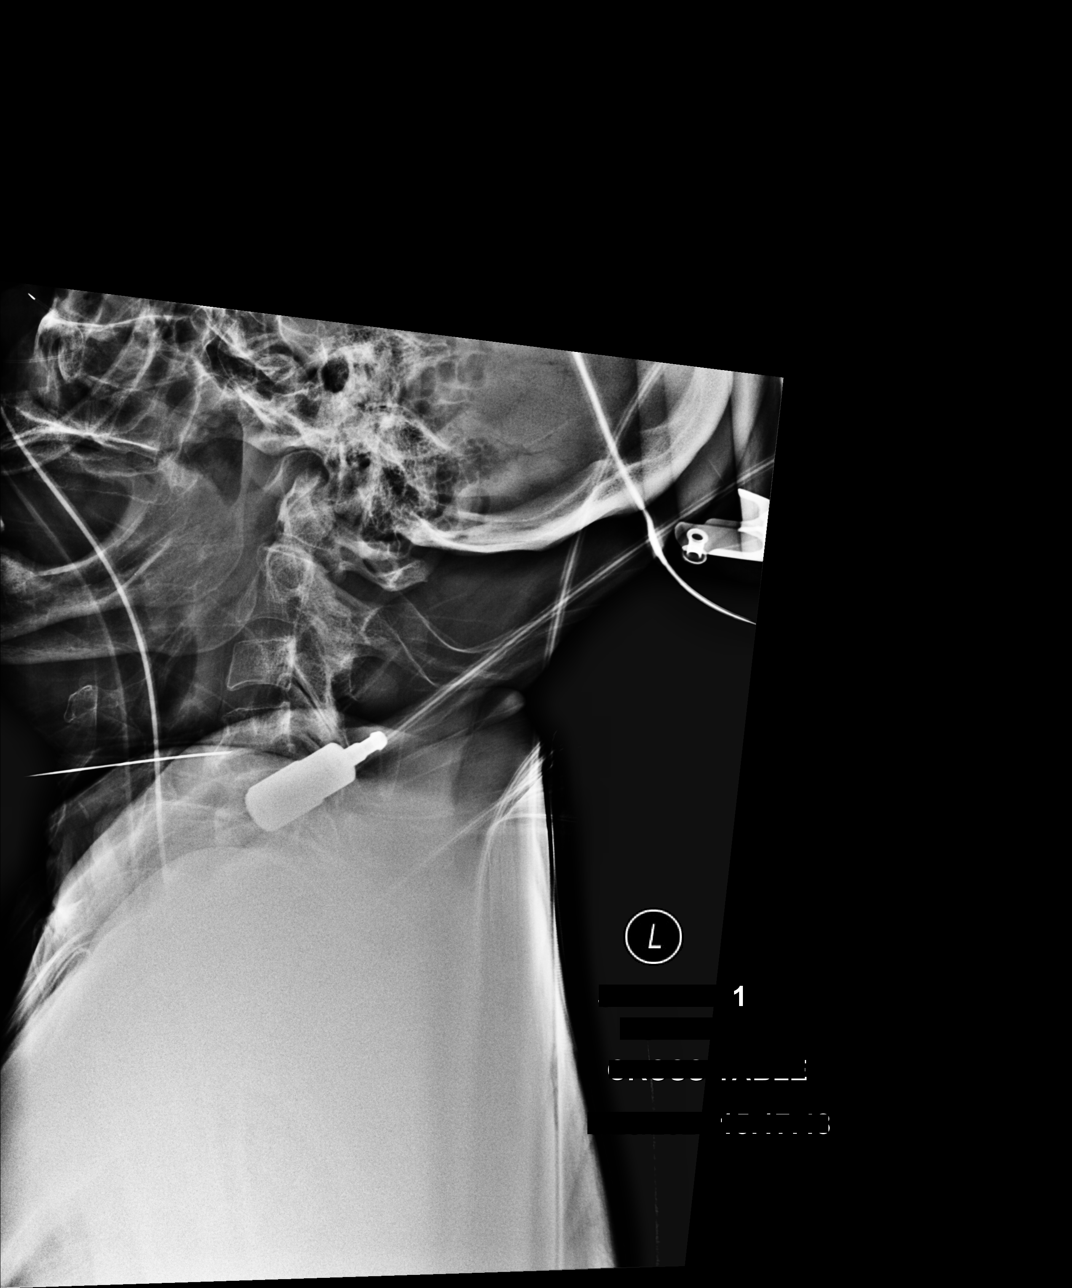

[xtable (2 of 3)]
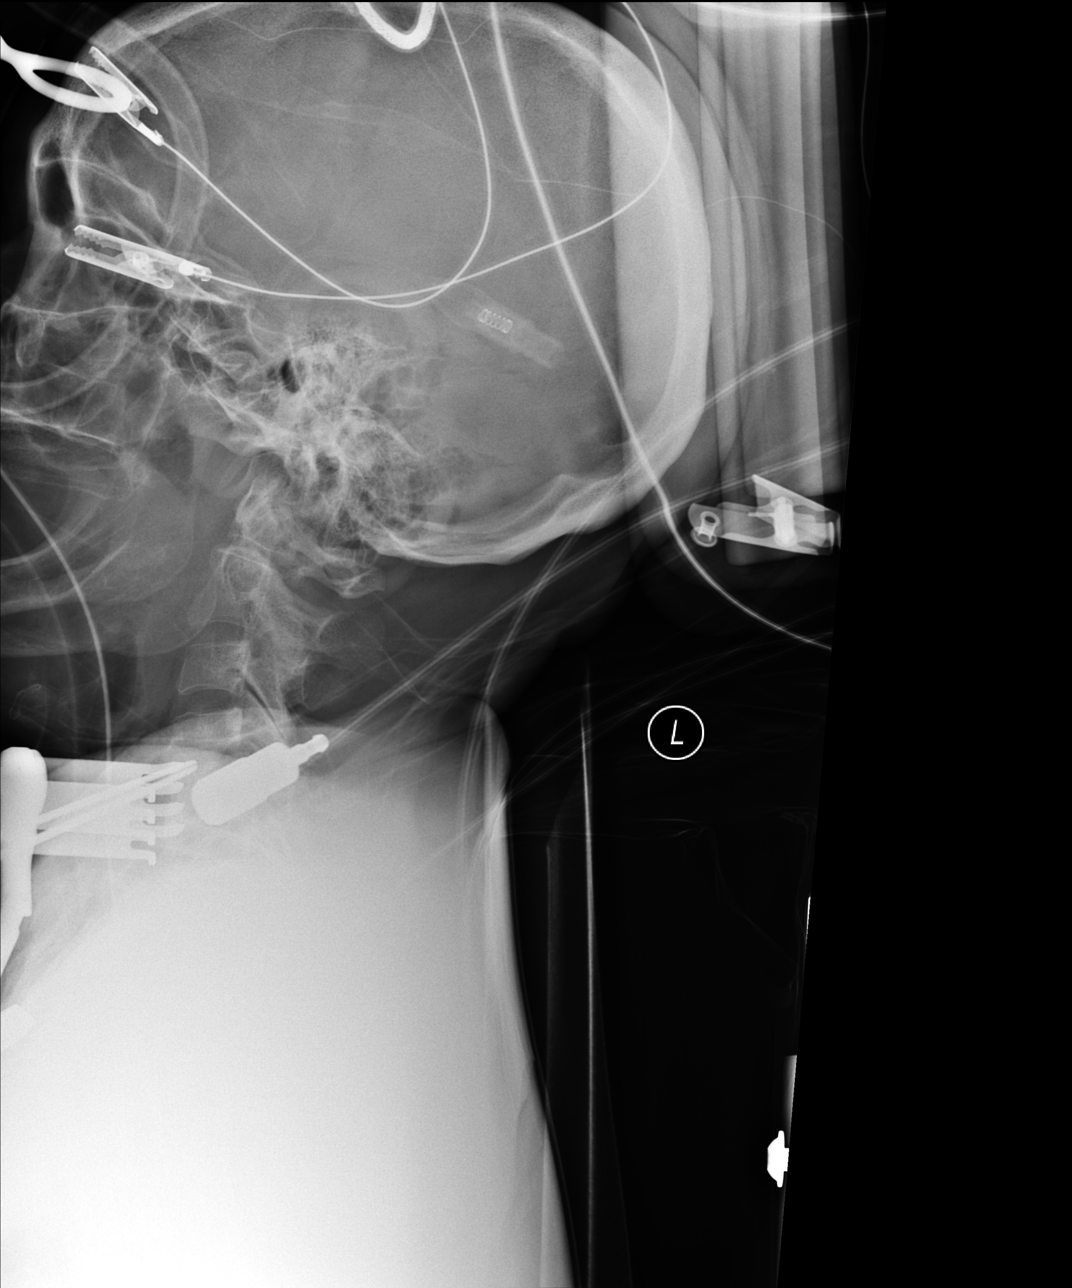

[xtable (3 of 3)]
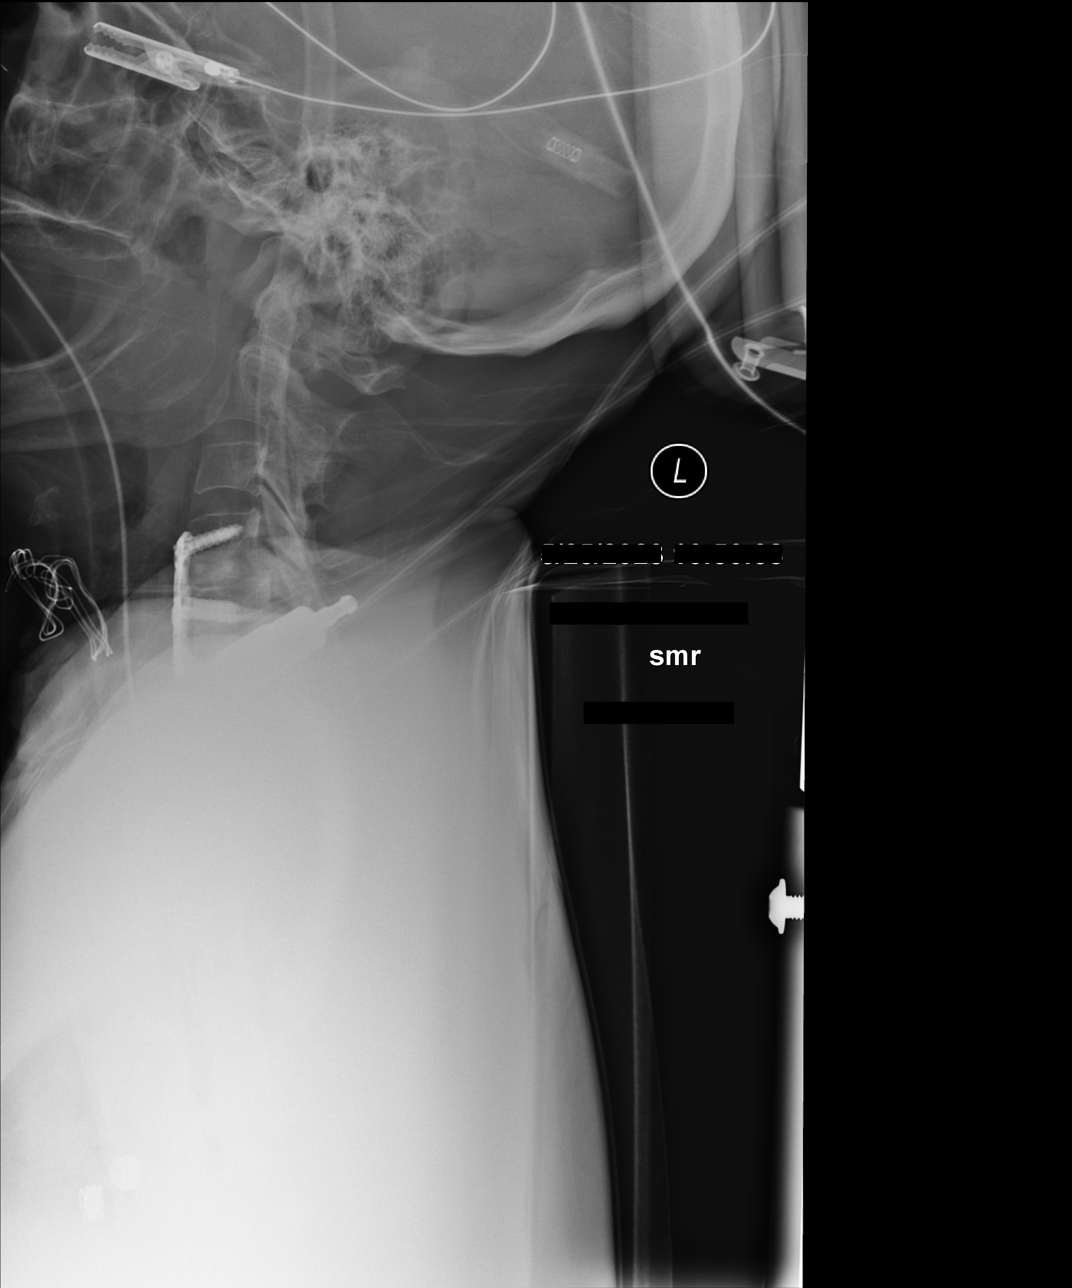

[3 of 3 positions shown; findings below may reference images not displayed]

FINDINGS: 3 images in the operating room were obtained for surgical
localization

Image number 1 reveals localization of the C4-5 disc space with a
needle from an anterior approach

Image number 2 demonstrates tissue spreaders at C4-5. Surgical
instrument overlying C4-5.

Image number 3 demonstrates ACDF with anterior plate at C4-5 and
C5-6. C7 not visualized due to overlying shoulders.
IMPRESSION: ACDF C4-5 and C5-6.  Lower margin of the fusion is not fully imaged.

## 2024-10-03 ENCOUNTER — Encounter: Payer: Self-pay | Admitting: *Deleted

## 2024-10-06 ENCOUNTER — Other Ambulatory Visit: Payer: Self-pay | Admitting: *Deleted

## 2024-10-06 DIAGNOSIS — K829 Disease of gallbladder, unspecified: Secondary | ICD-10-CM

## 2024-10-12 ENCOUNTER — Ambulatory Visit (INDEPENDENT_AMBULATORY_CARE_PROVIDER_SITE_OTHER): Admitting: Surgery

## 2024-10-12 ENCOUNTER — Encounter: Payer: Self-pay | Admitting: Surgery

## 2024-10-12 VITALS — BP 114/44 | HR 50 | Temp 97.5°F | Resp 16 | Ht 66.0 in | Wt 134.0 lb

## 2024-10-12 DIAGNOSIS — K8020 Calculus of gallbladder without cholecystitis without obstruction: Secondary | ICD-10-CM | POA: Diagnosis not present

## 2024-10-12 NOTE — H&P (Signed)
 [facility] History and Physical  Reason for Referral: Gallstones, gallbladder disease Referring Physician: Morna Qua, NP  Chief Complaint   New Patient (Initial Visit)     KARLYE IHRIG is a 68 y.o. female.  HPI: Patient presents for evaluation of gallstones.  She was admitted in June for a UTI with bacteremia and falls.  During that admission she underwent a CT scan which demonstrated distention of the gallbladder and concern for gallstones.  For over a year she has been having right upper quadrant abdominal pain with nausea and bloating that is worsened after eating any food.  The only thing that seems to improve her pain is time.  She denies any episodes of vomiting with these episodes.  Her past medical history is significant for hypertension, hyperlipidemia, and arthritis.  She takes oxycodone  and extended release morphine for her arthritis pains.  She denies use of blood thinning medications.  Her surgical history is significant for a laparoscopic hysterectomy in 2004/2005.  She quit smoking 1 year ago.  She denies use of alcohol  and illicit drugs.  Past Medical History:  Diagnosis Date   Anemia    no current problem   Anxiety    Arthritis    no meds   Diverticulitis    HLD (hyperlipidemia)    Hypertension    Pre-diabetes    no meds    Past Surgical History:  Procedure Laterality Date   ABDOMINAL HYSTERECTOMY     ANTERIOR CERVICAL DECOMP/DISCECTOMY FUSION N/A 05/14/2022   Procedure: C4-5, C5-6 ACDF;  Surgeon: Gillie Duncans, MD;  Location: [facility] OR;  Service: Neurosurgery;  Laterality: N/A;  3C/RM 21   right knee surgery  2005   TUBAL LIGATION     WISDOM TOOTH EXTRACTION      No family history on file.  Social History   Tobacco Use   Smoking status: Former    Current packs/day: 0.00    Average packs/day: 1.5 packs/day for 40.0 years (60.0 ttl pk-yrs)    Types: Cigarettes    Start date: 04/20/1982    Quit date: 04/20/2022    Years since quitting:  2.4   Smokeless tobacco: Never   Tobacco comments:    Quit 04/20/22  Vaping Use   Vaping status: Never Used  Substance Use Topics   Alcohol  use: No   Drug use: Never    Medications: I have reviewed the patient's current medications. Allergies as of 10/12/2024       Reactions   Bee Venom Anaphylaxis        Medication List        Accurate as of October 12, 2024  9:40 AM. If you have any questions, ask your nurse or doctor.          Biotin 5000 MCG Caps Take 5,000 mcg by mouth daily.   bumetanide 0.5 MG tablet Commonly known as: BUMEX Take 0.25-0.5 mg by mouth daily as needed.   cyclobenzaprine  10 MG tablet Commonly known as: FLEXERIL  Take 10 mg by mouth 3 (three) times daily as needed for muscle spasms.   DULoxetine 60 MG capsule Commonly known as: CYMBALTA Take 120 mg by mouth daily.   Flaxseed Oil 1000 MG Caps Take 1,000 mg by mouth in the morning, at noon, and at bedtime.   gabapentin  800 MG tablet Commonly known as: NEURONTIN  Take 800 mg by mouth 3 (three) times daily.   LORazepam  1 MG tablet Commonly known as: ATIVAN  Take 1 mg by mouth 2 (two) times  daily as needed for anxiety or sleep.   metoprolol tartrate 25 MG tablet Commonly known as: LOPRESSOR Take 25 mg by mouth 2 (two) times daily.   morphine 15 MG tablet Commonly known as: MSIR Take 15 mg by mouth every 12 (twelve) hours.   oxyCODONE -acetaminophen  10-325 MG tablet Commonly known as: PERCOCET Take 1 tablet by mouth every 6 (six) hours as needed for pain.   rOPINIRole 1 MG tablet Commonly known as: REQUIP Take 1 mg by mouth at bedtime.   simvastatin 20 MG tablet Commonly known as: ZOCOR Take 20 mg by mouth at bedtime.   spironolactone 25 MG tablet Commonly known as: ALDACTONE Take 25 mg by mouth daily.   Vitamin D3 125 MCG (5000 UT) Caps Take 5,000 Units by mouth daily.         ROS:  Constitutional: negative for chills, fatigue, and fevers Eyes: negative for visual  disturbance and pain Ears, nose, mouth, throat, and face: negative for ear drainage, sore throat, and sinus problems Respiratory: negative for cough, wheezing, and shortness of breath Cardiovascular: negative for chest pain and palpitations Gastrointestinal: positive for abdominal pain, nausea, and reflux symptoms, negative for vomiting Genitourinary:negative for dysuria and frequency Integument/breast: positive for dryness, negative for rash Hematologic/lymphatic: negative for bleeding and lymphadenopathy Musculoskeletal:positive for back pain, negative for neck pain Neurological: negative for dizziness and tremors Endocrine: negative for temperature intolerance  Blood pressure (!) 114/44, pulse (!) 50, temperature (!) 97.5 F (36.4 C), temperature source Oral, resp. rate 16, height 5' 6 (1.676 m), weight 134 lb (60.8 kg), SpO2 95%. Physical Exam Vitals reviewed.  Constitutional:      Appearance: Normal appearance.  HENT:     Head: Normocephalic and atraumatic.  Eyes:     Extraocular Movements: Extraocular movements intact.  Cardiovascular:     Rate and Rhythm: Normal rate and regular rhythm.  Pulmonary:     Effort: Pulmonary effort is normal.     Breath sounds: Normal breath sounds.  Abdominal:     Comments: Abdomen soft, nondistended, no percussion tenderness, mild right upper quadrant tenderness to palpation; no rigidity, guarding, rebound tenderness; negative Murphy sign  Musculoskeletal:        General: Normal range of motion.     Cervical back: Normal range of motion.  Skin:    General: Skin is warm and dry.  Neurological:     General: No focal deficit present.     Mental Status: She is alert and oriented to person, place, and time.  Psychiatric:        Mood and Affect: Mood normal.        Behavior: Behavior normal.     Results: CT abdomen and pelvis without IV contrast (06/11/2024): Impression: -No loculated fluid to suggest abscess.  No definitive inflammatory  changes seen in the abdomen and pelvis. -Mildly distended gallbladder with gallstone suspected. -Moderate constipation   Assessment & Plan:  THARA SEARING is a 68 y.o. female who presents for evaluation of gallstones.  -We discussed the pathophysiology of gallbladder disease and the recommendations for surgery.  We also discussed that if any of her symptoms are not related to gallbladder disease, gallbladder removal will not affect the systems, however I do suspect that her pain and nausea are related to gallbladder disease and gallstones -I counseled the patient about the indication, risks and benefits of robotic assisted laparoscopic cholecystectomy.  She understands there is a very small chance for bleeding, infection, injury to normal structures (including common bile  duct), conversion to open surgery, persistent symptoms, evolution of postcholecystectomy diarrhea, need for secondary interventions, anesthesia reaction, cardiopulmonary issues and other risks not specifically detailed here. I described the expected recovery, the plan for follow-up and the restrictions during the recovery phase.  All questions were answered. -Patient tentatively scheduled for surgery on 11/7 -We discussed that I will not prescribe her any narcotic pain medications postoperatively, as she is already on 2 different narcotics -Information provided to the patient regarding cholelithiasis, cholecystitis, cholecystectomy, and low fat diet -Advised that she needs to present to the ED if she has worsening abdominal pain, nausea, vomiting, fever, and chills  All questions were answered to the satisfaction of the patient.  Note: Portions of this report may have been transcribed using voice recognition software. Every effort has been made to ensure accuracy; however, inadvertent computerized transcription errors may still be present.   Dorothyann Brittle, DO [facility] [address] [place], [place] [address] [phone] (office)

## 2024-10-12 NOTE — Progress Notes (Signed)
 [facility] History and Physical  Reason for Referral: Gallstones, gallbladder disease Referring Physician: Morna Qua, NP  Chief Complaint   New Patient (Initial Visit)     Kelly [name] is a 68 y.o. female.  HPI: Patient presents for evaluation of gallstones.  She was admitted in June for a UTI with bacteremia and falls.  During that admission she underwent a CT scan which demonstrated distention of the gallbladder and concern for gallstones.  For over a year she has been having right upper quadrant abdominal pain with nausea and bloating that is worsened after eating any food.  The only thing that seems to improve her pain is time.  She denies any episodes of vomiting with these episodes.  Her past medical history is significant for hypertension, hyperlipidemia, and arthritis.  She takes oxycodone  and extended release morphine for her arthritis pains.  She denies use of blood thinning medications.  Her surgical history is significant for a laparoscopic hysterectomy in 2004/2005.  She quit smoking 1 year ago.  She denies use of alcohol  and illicit drugs.  Past Medical History:  Diagnosis Date   Anemia    no current problem   Anxiety    Arthritis    no meds   Diverticulitis    HLD (hyperlipidemia)    Hypertension    Pre-diabetes    no meds    Past Surgical History:  Procedure Laterality Date   ABDOMINAL HYSTERECTOMY     ANTERIOR CERVICAL DECOMP/DISCECTOMY FUSION N/A 05/14/2022   Procedure: C4-5, C5-6 ACDF;  Surgeon: Gillie Duncans, MD;  Location: [facility] OR;  Service: Neurosurgery;  Laterality: N/A;  3C/RM 21   right knee surgery  2005   TUBAL LIGATION     WISDOM TOOTH EXTRACTION      No family history on file.  Social History   Tobacco Use   Smoking status: Former    Current packs/day: 0.00    Average packs/day: 1.5 packs/day for 40.0 years (60.0 ttl pk-yrs)    Types: Cigarettes    Start date: 04/20/1982    Quit date: 04/20/2022    Years since quitting:  2.4   Smokeless tobacco: Never   Tobacco comments:    Quit 04/20/22  Vaping Use   Vaping status: Never Used  Substance Use Topics   Alcohol  use: No   Drug use: Never    Medications: I have reviewed the patient's current medications. Allergies as of 10/12/2024       Reactions   Bee Venom Anaphylaxis        Medication List        Accurate as of October 12, 2024  9:40 AM. If you have any questions, ask your nurse or doctor.          Biotin 5000 MCG Caps Take 5,000 mcg by mouth daily.   bumetanide 0.5 MG tablet Commonly known as: BUMEX Take 0.25-0.5 mg by mouth daily as needed.   cyclobenzaprine  10 MG tablet Commonly known as: FLEXERIL  Take 10 mg by mouth 3 (three) times daily as needed for muscle spasms.   DULoxetine 60 MG capsule Commonly known as: CYMBALTA Take 120 mg by mouth daily.   Flaxseed Oil 1000 MG Caps Take 1,000 mg by mouth in the morning, at noon, and at bedtime.   gabapentin  800 MG tablet Commonly known as: NEURONTIN  Take 800 mg by mouth 3 (three) times daily.   LORazepam  1 MG tablet Commonly known as: ATIVAN  Take 1 mg by mouth 2 (two) times  daily as needed for anxiety or sleep.   metoprolol tartrate 25 MG tablet Commonly known as: LOPRESSOR Take 25 mg by mouth 2 (two) times daily.   morphine 15 MG tablet Commonly known as: MSIR Take 15 mg by mouth every 12 (twelve) hours.   oxyCODONE -acetaminophen  10-325 MG tablet Commonly known as: PERCOCET Take 1 tablet by mouth every 6 (six) hours as needed for pain.   rOPINIRole 1 MG tablet Commonly known as: REQUIP Take 1 mg by mouth at bedtime.   simvastatin 20 MG tablet Commonly known as: ZOCOR Take 20 mg by mouth at bedtime.   spironolactone 25 MG tablet Commonly known as: ALDACTONE Take 25 mg by mouth daily.   Vitamin D3 125 MCG (5000 UT) Caps Take 5,000 Units by mouth daily.         ROS:  Constitutional: negative for chills, fatigue, and fevers Eyes: negative for visual  disturbance and pain Ears, nose, mouth, throat, and face: negative for ear drainage, sore throat, and sinus problems Respiratory: negative for cough, wheezing, and shortness of breath Cardiovascular: negative for chest pain and palpitations Gastrointestinal: positive for abdominal pain, nausea, and reflux symptoms, negative for vomiting Genitourinary:negative for dysuria and frequency Integument/breast: positive for dryness, negative for rash Hematologic/lymphatic: negative for bleeding and lymphadenopathy Musculoskeletal:positive for back pain, negative for neck pain Neurological: negative for dizziness and tremors Endocrine: negative for temperature intolerance  Blood pressure (!) 114/44, pulse (!) 50, temperature (!) 97.5 F (36.4 C), temperature source Oral, resp. rate 16, height 5' 6 (1.676 m), weight 134 lb (60.8 kg), SpO2 95%. Physical Exam Vitals reviewed.  Constitutional:      Appearance: Normal appearance.  HENT:     Head: Normocephalic and atraumatic.  Eyes:     Extraocular Movements: Extraocular movements intact.  Cardiovascular:     Rate and Rhythm: Normal rate and regular rhythm.  Pulmonary:     Effort: Pulmonary effort is normal.     Breath sounds: Normal breath sounds.  Abdominal:     Comments: Abdomen soft, nondistended, no percussion tenderness, mild right upper quadrant tenderness to palpation; no rigidity, guarding, rebound tenderness; negative Murphy sign  Musculoskeletal:        General: Normal range of motion.     Cervical back: Normal range of motion.  Skin:    General: Skin is warm and dry.  Neurological:     General: No focal deficit present.     Mental Status: She is alert and oriented to person, place, and time.  Psychiatric:        Mood and Affect: Mood normal.        Behavior: Behavior normal.     Results: CT abdomen and pelvis without IV contrast (06/11/2024): Impression: -No loculated fluid to suggest abscess.  No definitive inflammatory  changes seen in the abdomen and pelvis. -Mildly distended gallbladder with gallstone suspected. -Moderate constipation   Assessment & Plan:  Kelly [name] is a 68 y.o. female who presents for evaluation of gallstones.  -We discussed the pathophysiology of gallbladder disease and the recommendations for surgery.  We also discussed that if any of her symptoms are not related to gallbladder disease, gallbladder removal will not affect the systems, however I do suspect that her pain and nausea are related to gallbladder disease and gallstones -I counseled the patient about the indication, risks and benefits of robotic assisted laparoscopic cholecystectomy.  She understands there is a very small chance for bleeding, infection, injury to normal structures (including common bile  duct), conversion to open surgery, persistent symptoms, evolution of postcholecystectomy diarrhea, need for secondary interventions, anesthesia reaction, cardiopulmonary issues and other risks not specifically detailed here. I described the expected recovery, the plan for follow-up and the restrictions during the recovery phase.  All questions were answered. -Patient tentatively scheduled for surgery on 11/7 -We discussed that I will not prescribe her any narcotic pain medications postoperatively, as she is already on 2 different narcotics -Information provided to the patient regarding cholelithiasis, cholecystitis, cholecystectomy, and low fat diet -Advised that she needs to present to the ED if she has worsening abdominal pain, nausea, vomiting, fever, and chills  All questions were answered to the satisfaction of the patient.  Note: Portions of this report may have been transcribed using voice recognition software. Every effort has been made to ensure accuracy; however, inadvertent computerized transcription errors may still be present.   Dorothyann Brittle, DO [facility] [address] [place], [place] [address] [phone] (office)

## 2024-10-23 NOTE — Patient Instructions (Signed)
 Kelly [name]  10/23/2024     @PREFPERIOPPHARMACY @   Your procedure is scheduled on  10/27/2024.   [facility] to [facility] at  0740 A.M.   Call this number if you have problems the morning of surgery:  [phone]  If you experience any cold or flu symptoms such as cough, fever, chills, shortness of breath, etc. between now and your scheduled surgery, please notify us  at the above number.   Remember:  Do not eat or after midnight.   You may drink clear liquids until 0540 am on 10/27/2024.    Clear liquids allowed are:                    Water, Carbonated beverages (diabetics please choose diet or no sugar options), Clear Tea (No creamer, milk, or cream, including half & half and powdered creamer), Black Coffee Only (No creamer, milk or cream, including half & half and powdered creamer), and Clear Sports drink (No red color; diabetics please choose diet or no sugar options)    Take these medicines the morning of surgery with A SIP OF WATER        cyclobenzaprine , duloxetine, gabapentin , lorazepam ,(if needed), metoprolol, MS Contin or oxycodone .    Do not wear jewelry, make-up or nail polish, including gel polish,  artificial nails, or any other type of covering on natural nails (fingers and  toes).  Do not wear lotions, powders, or perfumes, or deodorant.  Do not shave 48 hours prior to surgery.  Men may shave face and neck.  Do not bring valuables to the hospital.  [facility] is not responsible for any belongings or valuables.  Contacts, dentures or bridgework may not be worn into surgery.  Leave your suitcase in the car.  After surgery it may be brought to your room.  For patients admitted to the hospital, discharge time will be determined by your treatment team.  Patients discharged the day of surgery will not be allowed to drive home and must have someone with them for 24 hours.    Special instructions:   DO NOT smoke tobacco or vape for 24 hours before your  procedure.  Please read over the following fact sheets that you were given. Coughing and Deep Breathing, Surgical Site Infection Prevention, Anesthesia Post-op Instructions, and Care and Recovery After Surgery      Minimally Invasive Cholecystectomy, Care After The following information offers guidance on how to care for yourself after your procedure. Your health care provider may also give you more specific instructions. If you have problems or questions, contact your health care provider. What can I expect after the procedure? After the procedure, it is common to have: Pain at your incision sites. You will be given medicines to control this pain. Mild nausea or vomiting. Bloating and possible shoulder pain from the gas that was used during the procedure. Follow these instructions at home: Medicines Take over-the-counter and prescription medicines only as told by your health care provider. If you were prescribed an antibiotic medicine, take it as told by your health care provider. Do not stop using the antibiotic even if you start to feel better. Ask your health care provider if the medicine prescribed to you: Requires you to avoid driving or using machinery. Can cause constipation. You may need to take these actions to prevent or treat constipation: Drink enough fluid to keep your urine pale yellow. Take over-the-counter or prescription medicines. Eat foods  that are high in fiber, such as beans, whole grains, and fresh fruits and vegetables. Limit foods that are high in fat and processed sugars, such as fried or sweet foods. Incision care  Follow instructions from your health care provider about how to take care of your incisions. Make sure you: Wash your hands with soap and water for at least 20 seconds before and after you change your bandage (dressing). If soap and water are not available, use hand sanitizer. Change your dressing as told by your health care provider. Leave stitches  (sutures), skin glue, or adhesive strips in place. These skin closures may need to be in place for 2 weeks or longer. If adhesive strip edges start to loosen and curl up, you may trim the loose edges. Do not remove adhesive strips completely unless your health care provider tells you to do that. Do not take baths, swim, or use a hot tub until your health care provider approves. Ask your health care provider if you may take showers. You may only be allowed to take sponge baths. Check your incision area every day for signs of infection. Check for: More redness, swelling, or pain. Fluid or blood. Warmth. Pus or a bad smell. Activity Rest as told by your health care provider. Do not do activities that require a lot of effort. Avoid sitting for a long time without moving. Get up to take short walks every 1-2 hours. This is important to improve blood flow and breathing. Ask for help if you feel weak or unsteady. Do not lift anything that is heavier than 10 lb (4.5 kg), or the limit that you are told, until your health care provider says that it is safe. Do not play contact sports until your health care provider approves. Do not return to work or school until your health care provider approves. Return to your normal activities as told by your health care provider. Ask your health care provider what activities are safe for you. General instructions If you were given a sedative during the procedure, it can affect you for several hours. Do not drive or operate machinery until your health care provider says that it is safe. Keep all follow-up visits. This is important. Contact a health care provider if: You develop a rash. You have more redness, swelling, or pain around your incisions. You have fluid or blood coming from your incisions. Your incisions feel warm to the touch. You have pus or a bad smell coming from your incisions. You have a fever. One or more of your incisions breaks open. Get help  right away if: You have trouble breathing. You have chest pain. You have more pain in your shoulders. You faint or feel dizzy when you stand. You have severe pain in your abdomen. You have nausea or vomiting that lasts for more than one day. You have leg pain that is new or unusual, or if it is localized to one specific spot. These symptoms may represent a serious problem that is an emergency. Do not wait to see if the symptoms will go away. Get medical help right away. Call your local emergency services (911 in the [place] Do not drive yourself to the hospital. Summary After your procedure, it is common to have pain at the incision sites. You may also have nausea or bloating. Follow your health care provider's instructions about medicine, activity restrictions, and caring for your incision areas. Do not do activities that require a lot of effort. Contact a health care  provider if you have a fever or other signs of infection, such as more redness, swelling, or pain around the incisions. Get help right away if you have chest pain, increasing pain in the shoulders, or trouble breathing. This information is not intended to replace advice given to you by your health care provider. Make sure you discuss any questions you have with your health care provider. Document Revised: 06/09/2021 Document Reviewed: 06/10/2021 Elsevier Patient Education  2024 Else[employer] Inc.General Anesthesia, Adult, Care After The following information offers guidance on how to care for yourself after your procedure. Your health care provider may also give you more specific instructions. If you have problems or questions, contact your health care provider. What can I expect after the procedure? After the procedure, it is common for people to: Have pain or discomfort at the IV site. Have nausea or vomiting. Have a sore throat or hoarseness. Have trouble concentrating. Feel cold or chills. Feel weak, sleepy, or tired  (fatigue). Have soreness and body aches. These can affect parts of the body that were not involved in surgery. Follow these instructions at home: For the time period you were told by your health care provider:  Rest. Do not participate in activities where you could fall or become injured. Do not drive or use machinery. Do not drink alcohol . Do not take sleeping pills or medicines that cause drowsiness. Do not make important decisions or sign legal documents. Do not take care of children on your own. General instructions Drink enough fluid to keep your urine pale yellow. If you have sleep apnea, surgery and certain medicines can increase your risk for breathing problems. Follow instructions from your health care provider about wearing your sleep device: Anytime you are sleeping, including during daytime naps. While taking prescription pain medicines, sleeping medicines, or medicines that make you drowsy. Return to your normal activities as told by your health care provider. Ask your health care provider what activities are safe for you. Take over-the-counter and prescription medicines only as told by your health care provider. Do not use any products that contain nicotine or tobacco. These products include cigarettes, chewing tobacco, and vaping devices, such as e-cigarettes. These can delay incision healing after surgery. If you need help quitting, ask your health care provider. Contact a health care provider if: You have nausea or vomiting that does not get better with medicine. You vomit every time you eat or drink. You have pain that does not get better with medicine. You cannot urinate or have bloody urine. You develop a skin rash. You have a fever. Get help right away if: You have trouble breathing. You have chest pain. You vomit blood. These symptoms may be an emergency. Get help right away. Call 911. Do not wait to see if the symptoms will go away. Do not drive yourself to the  hospital. Summary After the procedure, it is common to have a sore throat, hoarseness, nausea, vomiting, or to feel weak, sleepy, or fatigue. For the time period you were told by your health care provider, do not drive or use machinery. Get help right away if you have difficulty breathing, have chest pain, or vomit blood. These symptoms may be an emergency. This information is not intended to replace advice given to you by your health care provider. Make sure you discuss any questions you have with your health care provider. Document Revised: 03/06/2022 Document Reviewed: 03/06/2022 Elsevier Patient Education  2024 Elsevier Inc.How to Use Chlorhexidine  at Home in the [employer]  Chlorhexidine  gluconate (CHG) is a germ-killing (antiseptic) wash that's used to clean the skin. It can get rid of the germs that normally live on the skin and can keep them away for about 24 hours. If you're having surgery, you may be told to shower with CHG at home the night before surgery. This can help lower your risk for infection. To use CHG wash in the shower, follow the steps below. Supplies needed: CHG body wash. Clean washcloth. Clean towel. How to use CHG in the shower Follow these steps unless you're told to use CHG in a different way: Start the shower. Use your normal soap and shampoo to wash your face and hair. Turn off the shower or move out of the shower stream. Pour CHG onto a clean washcloth. Do not use any type of brush or rough sponge. Start at your neck, washing your body down to your toes. Make sure you: Wash the part of your body where the surgery will be done for at least 1 minute. Do not scrub. Do not use CHG on your head or face unless your health care provider tells you to. If it gets into your ears or eyes, rinse them well with water. Do not wash your genitals with CHG. Wash your back and under your arms. Make sure to wash skin folds. Let the CHG sit on your skin for 1-2 minutes or as long as  told. Rinse your entire body in the shower, including all body creases and folds. Turn off the shower. Dry off with a clean towel. Do not put anything on your skin afterward, such as powder, lotion, or perfume. Put on clean clothes or pajamas. If it's the night before surgery, sleep in clean sheets. General tips Use CHG only as told, and follow the instructions on the label. Use the full amount of CHG as told. This is often one bottle. Do not smoke and stay away from flames after using CHG. Your skin may feel sticky after using CHG. This is normal. The sticky feeling will go away as the CHG dries. Do not use CHG: If you have a chlorhexidine  allergy or have reacted to chlorhexidine  in the past. On open wounds or areas of skin that have broken skin, cuts, or scrapes. On babies younger than 48 months of age. Contact a health care provider if: You have questions about using CHG. Your skin gets irritated or itchy. You have a rash after using CHG. You swallow any CHG. Call your local poison control center [phone] in the [place] Your eyes itch badly, or they become very red or swollen. Your hearing changes. You have trouble seeing. If you can't reach your provider, go to an urgent care or emergency room. Do not drive yourself. Get help right away if: You have swelling or tingling in your mouth or throat. You make high-pitched whistling sounds when you breathe, most often when you breathe out (wheeze). You have trouble breathing. These symptoms may be an emergency. Call 911 right away. Do not wait to see if the symptoms will go away. Do not drive yourself to the hospital. This information is not intended to replace advice given to you by your health care provider. Make sure you discuss any questions you have with your health care provider. Document Revised: 06/22/2023 Document Reviewed: 06/18/2022 Elsevier Patient Education  2024 [employer].

## 2024-10-24 ENCOUNTER — Encounter (HOSPITAL_COMMUNITY): Payer: Self-pay

## 2024-10-24 ENCOUNTER — Encounter (HOSPITAL_COMMUNITY)
Admission: RE | Admit: 2024-10-24 | Discharge: 2024-10-24 | Disposition: A | Discharge status: Home / Self Care | Source: Ambulatory Visit | Attending: Surgery | Admitting: Surgery

## 2024-10-24 VITALS — BP 114/44 | HR 50 | Resp 18 | Ht 66.0 in | Wt 134.0 lb

## 2024-10-24 DIAGNOSIS — R7303 Prediabetes: Secondary | ICD-10-CM | POA: Diagnosis not present

## 2024-10-24 DIAGNOSIS — I10 Essential (primary) hypertension: Secondary | ICD-10-CM | POA: Insufficient documentation

## 2024-10-24 DIAGNOSIS — Z01818 Encounter for other preprocedural examination: Secondary | ICD-10-CM | POA: Diagnosis present

## 2024-10-24 HISTORY — DX: Nausea with vomiting, unspecified: R11.2

## 2024-10-24 HISTORY — DX: Other specified postprocedural states: Z98.890

## 2024-10-24 LAB — HEMOGLOBIN A1C
Hgb A1c MFr Bld: 6 % — ABNORMAL HIGH (ref 4.8–5.6)
Mean Plasma Glucose: 125.5 mg/dL

## 2024-10-27 ENCOUNTER — Encounter (HOSPITAL_COMMUNITY)
Admission: RE | Disposition: A | Discharge status: Home / Self Care | Payer: Self-pay | Source: Home / Self Care | Attending: Surgery

## 2024-10-27 ENCOUNTER — Ambulatory Visit (HOSPITAL_COMMUNITY)
Admission: RE | Admit: 2024-10-27 | Discharge: 2024-10-27 | Disposition: A | Discharge status: Home / Self Care | Attending: Surgery | Admitting: Surgery

## 2024-10-27 ENCOUNTER — Other Ambulatory Visit: Payer: Self-pay

## 2024-10-27 ENCOUNTER — Ambulatory Visit (HOSPITAL_BASED_OUTPATIENT_CLINIC_OR_DEPARTMENT_OTHER)

## 2024-10-27 ENCOUNTER — Encounter (HOSPITAL_COMMUNITY): Payer: Self-pay | Admitting: Surgery

## 2024-10-27 ENCOUNTER — Ambulatory Visit (HOSPITAL_COMMUNITY)

## 2024-10-27 DIAGNOSIS — Z90710 Acquired absence of both cervix and uterus: Secondary | ICD-10-CM | POA: Insufficient documentation

## 2024-10-27 DIAGNOSIS — F419 Anxiety disorder, unspecified: Secondary | ICD-10-CM | POA: Insufficient documentation

## 2024-10-27 DIAGNOSIS — Z87891 Personal history of nicotine dependence: Secondary | ICD-10-CM | POA: Insufficient documentation

## 2024-10-27 DIAGNOSIS — K8020 Calculus of gallbladder without cholecystitis without obstruction: Secondary | ICD-10-CM

## 2024-10-27 DIAGNOSIS — K8010 Calculus of gallbladder with chronic cholecystitis without obstruction: Secondary | ICD-10-CM | POA: Diagnosis present

## 2024-10-27 DIAGNOSIS — R7303 Prediabetes: Secondary | ICD-10-CM | POA: Diagnosis not present

## 2024-10-27 DIAGNOSIS — I10 Essential (primary) hypertension: Secondary | ICD-10-CM | POA: Insufficient documentation

## 2024-10-27 DIAGNOSIS — Z79891 Long term (current) use of opiate analgesic: Secondary | ICD-10-CM | POA: Insufficient documentation

## 2024-10-27 DIAGNOSIS — M1990 Unspecified osteoarthritis, unspecified site: Secondary | ICD-10-CM | POA: Diagnosis not present

## 2024-10-27 DIAGNOSIS — E785 Hyperlipidemia, unspecified: Secondary | ICD-10-CM | POA: Diagnosis not present

## 2024-10-27 LAB — BASIC METABOLIC PANEL WITH GFR
Anion gap: 9 (ref 5–15)
BUN: 13 mg/dL (ref 8–23)
CO2: 28 mmol/L (ref 22–32)
Calcium: 9.6 mg/dL (ref 8.9–10.3)
Chloride: 101 mmol/L (ref 98–111)
Creatinine, Ser: 0.96 mg/dL (ref 0.44–1.00)
GFR, Estimated: >60 mL/min (ref >60)
Glucose, Bld: 92 mg/dL (ref 70–99)
Potassium: 4.4 mmol/L (ref 3.5–5.1)
Sodium: 138 mmol/L (ref 135–145)

## 2024-10-27 SURGERY — CHOLECYSTECTOMY, ROBOT-ASSISTED, LAPAROSCOPIC
Anesthesia: General | Site: Abdomen

## 2024-10-27 MED ORDER — INDOCYANINE GREEN 25 MG IV SOLR: 2.5000 mg | Freq: Once | INTRAVENOUS | Status: AC

## 2024-10-27 MED ORDER — OXYCODONE HCL 5 MG/5ML PO SOLN: 5.0000 mg | Freq: Once | ORAL | Status: AC | PRN

## 2024-10-27 MED ORDER — ACETAMINOPHEN 500 MG PO TABS: 1000.0000 mg | ORAL_TABLET | Freq: Once | ORAL | Status: DC

## 2024-10-27 MED ORDER — PHENYLEPHRINE 80 MCG/ML (10ML) SYRINGE FOR IV PUSH (FOR BLOOD PRESSURE SUPPORT)
PREFILLED_SYRINGE | INTRAVENOUS | Status: AC
  Filled 2024-10-27: qty 10

## 2024-10-27 MED ORDER — SODIUM CHLORIDE 0.9 % IV SOLN
2.0000 g | INTRAVENOUS | Status: AC
  Administered 2024-10-27: 2 mg via INTRAVENOUS

## 2024-10-27 MED ORDER — ACETAMINOPHEN 160 MG/5ML PO SOLN
960.0000 mg | Freq: Once | ORAL | Status: DC
  Filled 2024-10-27: qty 30

## 2024-10-27 MED ORDER — DEXAMETHASONE SOD PHOSPHATE PF 10 MG/ML IJ SOLN
INTRAMUSCULAR | Status: DC | PRN
  Administered 2024-10-27: 10 mg via INTRAVENOUS

## 2024-10-27 MED ORDER — CHLORHEXIDINE GLUCONATE CLOTH 2 % EX PADS
6.0000 | MEDICATED_PAD | Freq: Once | CUTANEOUS | Status: AC
  Administered 2024-10-27: 6 via TOPICAL

## 2024-10-27 MED ORDER — FENTANYL CITRATE (PF) 250 MCG/5ML IJ SOLN
INTRAMUSCULAR | Status: AC
  Filled 2024-10-27: qty 5

## 2024-10-27 MED ORDER — CEFOTETAN DISODIUM 2 G IJ SOLR
INTRAMUSCULAR | Status: AC
  Filled 2024-10-27: qty 2

## 2024-10-27 MED ORDER — KETOROLAC TROMETHAMINE 30 MG/ML IJ SOLN
INTRAMUSCULAR | Status: AC
  Filled 2024-10-27: qty 1

## 2024-10-27 MED ORDER — MIDAZOLAM HCL 2 MG/2ML IJ SOLN
INTRAMUSCULAR | Status: AC
  Filled 2024-10-27: qty 2

## 2024-10-27 MED ORDER — CHLORHEXIDINE GLUCONATE 0.12 % MT SOLN
15.0000 mL | Freq: Once | OROMUCOSAL | Status: AC
  Administered 2024-10-27: 15 mL via OROMUCOSAL

## 2024-10-27 MED ORDER — STERILE WATER FOR IRRIGATION IR SOLN
Status: DC | PRN
  Administered 2024-10-27: 1000 mL

## 2024-10-27 MED ORDER — BUPIVACAINE HCL (PF) 0.5 % IJ SOLN
INTRAMUSCULAR | Status: AC
  Filled 2024-10-27: qty 30

## 2024-10-27 MED ORDER — SODIUM CHLORIDE 0.9 % IV SOLN: 12.5000 mg | INTRAVENOUS | Status: DC | PRN

## 2024-10-27 MED ORDER — KETOROLAC TROMETHAMINE 30 MG/ML IJ SOLN
INTRAMUSCULAR | Status: DC | PRN
  Administered 2024-10-27: 30 mg via INTRAVENOUS

## 2024-10-27 MED ORDER — LIDOCAINE HCL (CARDIAC) PF 100 MG/5ML IV SOSY
PREFILLED_SYRINGE | INTRAVENOUS | Status: DC | PRN
  Administered 2024-10-27: 40 mg via INTRAVENOUS

## 2024-10-27 MED ORDER — LACTATED RINGERS IV SOLN: INTRAVENOUS | Status: DC

## 2024-10-27 MED ORDER — ORAL CARE MOUTH RINSE: 15.0000 mL | Freq: Once | OROMUCOSAL | Status: AC

## 2024-10-27 MED ORDER — OXYCODONE HCL 5 MG PO TABS
5.0000 mg | ORAL_TABLET | Freq: Once | ORAL | Status: AC | PRN
  Administered 2024-10-27: 5 mg via ORAL
  Filled 2024-10-27: qty 1

## 2024-10-27 MED ORDER — ONDANSETRON HCL 4 MG PO TABS: 4.0000 mg | ORAL_TABLET | Freq: Every day | ORAL | 1 refills | Status: AC | PRN

## 2024-10-27 MED ORDER — ALBUMIN HUMAN 5 % IV SOLN
INTRAVENOUS | Status: AC
Start: 2024-10-27 — End: 2024-10-27
  Filled 2024-10-27: qty 250

## 2024-10-27 MED ORDER — INDOCYANINE GREEN 25 MG IV SOLR
INTRAVENOUS | Status: AC
Start: 2024-10-27 — End: 2024-10-27
  Administered 2024-10-27: 2.5 mg via INTRAVENOUS
  Filled 2024-10-27: qty 10

## 2024-10-27 MED ORDER — PROPOFOL 500 MG/50ML IV EMUL
INTRAVENOUS | Status: AC
  Filled 2024-10-27: qty 50

## 2024-10-27 MED ORDER — EPHEDRINE SULFATE (PRESSORS) 25 MG/5ML IV SOSY
PREFILLED_SYRINGE | INTRAVENOUS | Status: DC | PRN
  Administered 2024-10-27 (×4): 5 mg via INTRAVENOUS

## 2024-10-27 MED ORDER — BUPIVACAINE HCL (PF) 0.5 % IJ SOLN
INTRAMUSCULAR | Status: DC | PRN
  Administered 2024-10-27: 30 mL

## 2024-10-27 MED ORDER — EPHEDRINE 5 MG/ML INJ
INTRAVENOUS | Status: AC
Start: 2024-10-27 — End: 2024-10-27
  Filled 2024-10-27: qty 5

## 2024-10-27 MED ORDER — FENTANYL CITRATE (PF) 100 MCG/2ML IJ SOLN
INTRAMUSCULAR | Status: DC | PRN
  Administered 2024-10-27 (×2): 50 ug via INTRAVENOUS
  Administered 2024-10-27: 75 ug via INTRAVENOUS
  Administered 2024-10-27: 25 ug via INTRAVENOUS
  Administered 2024-10-27: 50 ug via INTRAVENOUS

## 2024-10-27 MED ORDER — MIDAZOLAM HCL (PF) 2 MG/2ML IJ SOLN
INTRAMUSCULAR | Status: DC | PRN
  Administered 2024-10-27: 2 mg via INTRAVENOUS

## 2024-10-27 MED ORDER — ONDANSETRON HCL 4 MG/2ML IJ SOLN
INTRAMUSCULAR | Status: DC | PRN
  Administered 2024-10-27: 4 mg via INTRAVENOUS

## 2024-10-27 MED ORDER — HYDROMORPHONE HCL 1 MG/ML IJ SOLN
0.2500 mg | INTRAMUSCULAR | Status: DC | PRN
  Administered 2024-10-27 (×2): 0.5 mg via INTRAVENOUS
  Filled 2024-10-27 (×2): qty 0.5

## 2024-10-27 MED ORDER — ROCURONIUM BROMIDE 100 MG/10ML IV SOLN
INTRAVENOUS | Status: DC | PRN
  Administered 2024-10-27: 60 mg via INTRAVENOUS
  Administered 2024-10-27: 10 mg via INTRAVENOUS

## 2024-10-27 MED ORDER — SUGAMMADEX SODIUM 200 MG/2ML IV SOLN
INTRAVENOUS | Status: DC | PRN
  Administered 2024-10-27: 200 mg via INTRAVENOUS

## 2024-10-27 MED ORDER — CHLORHEXIDINE GLUCONATE CLOTH 2 % EX PADS: 6.0000 | MEDICATED_PAD | Freq: Once | CUTANEOUS | Status: DC

## 2024-10-27 MED ORDER — ALBUMIN HUMAN 5 % IV SOLN: INTRAVENOUS | Status: DC | PRN

## 2024-10-27 MED ORDER — SCOPOLAMINE 1 MG/3DAYS TD PT72
MEDICATED_PATCH | TRANSDERMAL | Status: DC
Start: 2024-10-27 — End: 2024-10-27
  Administered 2024-10-27: 1 mg via TRANSDERMAL
  Filled 2024-10-27: qty 1

## 2024-10-27 MED ORDER — SCOPOLAMINE 1 MG/3DAYS TD PT72: 1.0000 | MEDICATED_PATCH | Freq: Once | TRANSDERMAL | Status: DC

## 2024-10-27 MED ORDER — PROPOFOL 10 MG/ML IV BOLUS
INTRAVENOUS | Status: DC | PRN
  Administered 2024-10-27: 20 ug/kg/min via INTRAVENOUS
  Administered 2024-10-27: 60 mg via INTRAVENOUS

## 2024-10-27 MED ORDER — ACETAMINOPHEN 500 MG PO TABS
ORAL_TABLET | ORAL | Status: AC
  Filled 2024-10-27: qty 2

## 2024-10-27 SURGICAL SUPPLY — 41 items
BLADE SURG 15 STRL LF DISP TIS (BLADE) ×1 IMPLANT
CAUTERY HOOK MNPLR 1.6 DVNC XI (INSTRUMENTS) ×1 IMPLANT
CHLORAPREP W/TINT 26 (MISCELLANEOUS) ×1 IMPLANT
CLIP LIGATING HEM O LOK PURPLE (MISCELLANEOUS) ×1 IMPLANT
COVER LIGHT HANDLE (MISCELLANEOUS) IMPLANT
DEFOGGER SCOPE WARM SEASHARP (MISCELLANEOUS) ×1 IMPLANT
DERMABOND ADVANCED .7 DNX12 (GAUZE/BANDAGES/DRESSINGS) ×1 IMPLANT
DRAPE ARM DVNC X/XI (DISPOSABLE) ×4 IMPLANT
DRAPE COLUMN DVNC XI (DISPOSABLE) ×1 IMPLANT
DRIVER NDL MEGA SUTCUT DVNCXI (INSTRUMENTS) IMPLANT
DRIVER NDLE MEGA SUTCUT DVNCXI (INSTRUMENTS) ×1 IMPLANT
ELECTRODE REM PT RTRN 9FT ADLT (ELECTROSURGICAL) ×1 IMPLANT
FORCEPS BPLR R/ABLATION 8 DVNC (INSTRUMENTS) ×1 IMPLANT
FORCEPS PROGRASP DVNC XI (FORCEP) ×1 IMPLANT
GLOVE BIOGEL PI IND STRL 6.5 (GLOVE) ×2 IMPLANT
GLOVE BIOGEL PI IND STRL 7.0 (GLOVE) ×3 IMPLANT
GLOVE ECLIPSE 6.5 STRL STRAW (GLOVE) IMPLANT
GLOVE SURG SS PI 6.5 STRL IVOR (GLOVE) ×2 IMPLANT
GOWN STRL REUS W/TWL LRG LVL3 (GOWN DISPOSABLE) ×3 IMPLANT
GRASPER SUT TROCAR 14GX15 (MISCELLANEOUS) ×1 IMPLANT
IRRIGATOR SUCT 8 DISP DVNC XI (IRRIGATION / IRRIGATOR) IMPLANT
KIT TURNOVER KIT A (KITS) ×1 IMPLANT
MANIFOLD NEPTUNE II (INSTRUMENTS) ×1 IMPLANT
NDL HYPO 21X1.5 SAFETY (NEEDLE) ×1 IMPLANT
NDL INSUFFLATION 14GA 120MM (NEEDLE) ×1 IMPLANT
NEEDLE HYPO 21X1.5 SAFETY (NEEDLE) ×1 IMPLANT
NEEDLE INSUFFLATION 14GA 120MM (NEEDLE) ×1 IMPLANT
OBTURATOR OPTICALSTD 8 DVNC (TROCAR) ×1 IMPLANT
PACK LAP CHOLE LZT030E (CUSTOM PROCEDURE TRAY) ×1 IMPLANT
PAD ARMBOARD POSITIONER FOAM (MISCELLANEOUS) ×1 IMPLANT
PENCIL HANDSWITCHING (ELECTRODE) ×1 IMPLANT
POSITIONER HEAD 8X9X4 ADT (SOFTGOODS) ×1 IMPLANT
SEAL UNIV 5-12 XI (MISCELLANEOUS) ×4 IMPLANT
SET BASIN LINEN APH (SET/KITS/TRAYS/PACK) ×1 IMPLANT
SET TUBE SMOKE EVAC HIGH FLOW (TUBING) ×1 IMPLANT
SUT MNCRL AB 4-0 PS2 18 (SUTURE) ×2 IMPLANT
SUT STRATA 3-0 SH (SUTURE) IMPLANT
SUT VICRYL 0 UR6 27IN ABS (SUTURE) ×1 IMPLANT
SYR 30ML LL (SYRINGE) ×1 IMPLANT
SYSTEM RETRIEVL 5MM INZII UNIV (BASKET) ×1 IMPLANT
WATER STERILE IRR 500ML POUR (IV SOLUTION) ×1 IMPLANT

## 2024-10-27 NOTE — Discharge Instructions (Signed)
 Ambulatory Surgery Discharge Instructions  General Anesthesia or Sedation Do not drive or operate heavy machinery for 24 hours.  Do not consume alcohol, tranquilizers, sleeping medications, or any non-prescribed medications for 24 hours. Do not make important decisions or sign any important papers in the next 24 hours. You should have someone with you tonight at home.  Activity  You are advised to go directly home from the hospital.  Restrict your activities and rest for a day.  Resume light activity tomorrow. No heavy lifting over 10 lbs or strenuous exercise.  Fluids and Diet Begin with clear liquids, bouillon, dry toast, soda crackers.  If not nauseated, you may go to a regular diet when you desire.  Greasy and spicy foods are not advised.  Medications  If you have not had a bowel movement in 24 hours, take 2 tablespoons over the counter Milk of mag.             You May resume your blood thinners tomorrow (Aspirin, coumadin, or other).  You are being discharged with prescriptions for Opioid/Narcotic Medications: There are some specific considerations for these medications that you should know. Opioid Meds have risks & benefits. Addiction to these meds is always a concern with prolonged use Take medication only as directed Do not drive while taking narcotic pain medication Do not crush tablets or capsules Do not use a different container than medication was dispensed in Lock the container of medication in a cool, dry place out of reach of children and pets. Opioid medication can cause addiction Do not share with anyone else (this is a felony) Do not store medications for future use. Dispose of them properly.     Disposal:  Find a [employer]  household drug take back site near you.  If you can't get to a drug take back site, use the recipe below as a last resort to dispose of expired, unused or unwanted drugs. Disposal  (Do not dispose chemotherapy drugs this way, talk to your  prescribing doctor instead.) Step 1: Mix drugs (do not crush) with dirt, kitty litter, or used coffee grounds and add a small amount of water  to dissolve any solid medications. Step 2: Seal drugs in plastic bag. Step 3: Place plastic bag in trash. Step 4: Take prescription container and scratch out personal information, then recycle or throw away.  Operative Site  You have a liquid bandage over your incisions, this will begin to flake off in about a week. Ok to English as a second language teacher. Keep wound clean and dry. No baths or swimming. No lifting more than 10 pounds.  Contact Information: If you have questions or concerns, please call our office, [phone], Monday- Thursday 8AM-5PM and Friday 8AM-12Noon.  If it is after hours or on the weekend, please call [facility], [phone], and ask to speak to the surgeon on call for Dr. Evonnie at [facility].   SPECIFIC COMPLICATIONS TO WATCH FOR: Inability to urinate Fever over 101? F by mouth Nausea and vomiting lasting longer than 24 hours. Pain not relieved by medication ordered Swelling around the operative site Increased redness, warmth, hardness, around operative area Numbness, tingling, or cold fingers or toes Blood -soaked dressing, (small amounts of oozing may be normal) Increasing and progressive drainage from surgical area or exam site

## 2024-10-27 NOTE — Transfer of Care (Signed)
 Immediate Anesthesia Transfer of Care Note  Patient: Kelly [name]  Procedure(s) Performed: CHOLECYSTECTOMY, ROBOT-ASSISTED, LAPAROSCOPIC (Abdomen)  Patient Location: PACU  Anesthesia Type:General  Level of Consciousness: awake, alert , oriented, and patient cooperative  Airway & Oxygen Therapy: Patient Spontanous Breathing and Patient connected to face mask oxygen  Post-op Assessment: Report given to RN and Post -op Vital signs reviewed and stable  Post vital signs: Reviewed and stable  Last Vitals:  Vitals Value Taken Time  BP 119/58 10/27/24 12:24  Temp 36.8 C 10/27/24 12:24  Pulse 68 10/27/24 12:25  Resp 14 10/27/24 12:25  SpO2 100 % 10/27/24 12:25  Vitals shown include unfiled device data.  Last Pain:  Vitals:   10/27/24 0814  TempSrc: Oral  PainSc: 7       Patients Stated Pain Goal: 7 (10/27/24 9185)  Complications: No notable events documented.

## 2024-10-27 NOTE — Progress Notes (Signed)
 [facility]  Spoke with the patient's daughter in the consultation room.  I explained that she tolerated the procedure without difficulty.  She has dissolvable stitches under the skin with overlying skin glue.  This will flake off in 10 to 14 days.  She will take her previously prescribed narcotic pain medications as needed for pain. The patient will follow-up with me in 2 weeks.  All questions were answered to her expressed satisfaction.  Dorothyann Brittle, DO [facility] [address] [place], [place] [address] [phone] (office)

## 2024-10-27 NOTE — Anesthesia Procedure Notes (Signed)
 Procedure Name: Intubation Date/Time: 10/27/2024 10:25 AM  Performed by: Donna Birmingham, RNPre-anesthesia Checklist: Patient identified, Emergency Drugs available, Suction available and Patient being monitored Patient Re-evaluated:Patient Re-evaluated prior to induction Oxygen Delivery Method: Circle system utilized Preoxygenation: Pre-oxygenation with 100% oxygen Induction Type: IV induction Ventilation: Mask ventilation without difficulty Laryngoscope Size: Mac and 3 Grade View: Grade I Tube type: Oral Tube size: 7.0 mm Number of attempts: 1 Airway Equipment and Method: Stylet and Oral airway Placement Confirmation: ETT inserted through vocal cords under direct vision, positive ETCO2 and breath sounds checked- equal and bilateral Secured at: 21 cm Tube secured with: Tape Dental Injury: Teeth and Oropharynx as per pre-operative assessment

## 2024-10-27 NOTE — Op Note (Signed)
 [facility] Operative Note  10/27/24  Preoperative Diagnosis: Symptomatic Cholelithiasis   Postoperative Diagnosis: Same   Procedure(s) Performed: Robotic Assisted Laparoscopic Cholecystectomy   Surgeon: Dorothyann Brittle, DO   Assistants: No qualified resident was available    Anesthesia: General endotracheal   Anesthesiologist: Landry Dunnings, MD    Specimens: Gallbladder   Estimated Blood Loss: Minimal   Blood Replacement: None    Complications: None   Wound Class: Contaminated   Operative Indications: The patient was found to have cholelithiasis on imaging and was symptomatic.  We discussed the risk of the procedure including but not limited to bleeding, infection, injury to the common bile duct, bile leak, need for further procedures, chance of subtotal cholecystectomy.   Findings:  Distended and chronically inflamed gallbladder Fusiform dilation of the distal cystic/at junction of cystic duct to CBD- photodocumentation below Critical view of safety noted All clips intact at the end of the case Adequate hemostasis    Procedure: Firefly was given in the preoperative area. The patient was taken to the operating room and placed supine. General endotracheal anesthesia was induced. Intravenous antibiotics were administered per protocol.  An orogastric tube positioned to decompress the stomach. The abdomen was prepared and draped in the usual sterile fashion. A time-out was completed verifying correct patient, procedure, site, positioning, and implant(s) and/or special equipment prior to beginning this procedure.  Veress needle was placed at the infraumbilical area and insufflation was started after confirming a positive saline drop test and no immediate increase in abdominal pressure.  After reaching 15 mm, the Veress needle was removed and a 8 mm port was placed via optiview technique infraumbilical, measuring 20 mm away from the suspected position of  the gallbladder.  The abdomen was inspected and no abnormalities or injuries were found.  Under direct vision, ports were placed in the following locations in a semi curvilinear position around the target of the gallbladder: Two 8 mm ports on the patient's right each having 8cm clearance to the adjacent ports and one 8 mm port placed on the patient's left 8 cm from the umbilical port. Once ports were placed, the table was placed in the reverse Trendelenburg position with the right side up. The Xi platform was brought into the operative field and docked to the ports successfully.  An endoscope was placed through the umbilical port, prograsp through the most lateral right port, fenestrated bipolar to the port just right of the umbilicus, and then a hook cautery in the left port.  The dome of the gallbladder was grasped with prograsp and retracted over the dome of the liver. Adhesions between the gallbladder and omentum, duodenum and transverse colon were lysed via hook cautery. The infundibulum was grasped with the fenestrated grasper and retracted toward the right lower quadrant. This maneuver exposed Calot's triangle. Firefly was used throughout the dissection to ensure safe visualization of the cystic duct.  The peritoneum overlying the gallbladder infundibulum was then dissected and the cystic duct and cystic artery identified.  Critical view of safety with the liver bed clearly visible behind the duct and artery with no additional structures noted.  The cystic duct was noted to have a fusiform dilation at the distal aspect/at the junction with the CBD.  The cystic duct and cystic artery were doubly clipped and divided close to the gallbladder.  Care was taken to clip above this dilated area.  The edge of the cystic duct was then oversewn with 3-0 statrafix.  The gallbladder was then  dissected from its peritoneal and liver bed attachments by electrocautery. Hemostasis was checked prior to removing the hook  cautery.  The Anton was undocked and moved out of the field.  A 5mm Endo Catch bag was then placed through the umbilical port and the gallbladder was removed.  The gallbladder was passed off the table as a specimen. There was no evidence of bleeding from the gallbladder fossa or cystic artery or leakage of the bile from the cystic duct stump. The umbilical port site closed with a 0 vicryl with a PMI needle.  The abdomen was desufflated and secondary trocars were removed under direct vision. No bleeding was noted. Incisions were localized with marcaine.  All skin incisions were closed with subcuticular sutures of 4-0 monocryl and dermabond.   Final inspection revealed acceptable hemostasis. All counts were correct at the end of the case. The patient was awakened from anesthesia and extubated without complication. The OG tube was removed.  The patient went to the PACU in stable condition.   Dorothyann Brittle, DO [facility] [address] [place], [place] [address] [phone] (office)

## 2024-10-27 NOTE — Interval H&P Note (Signed)
 History and Physical Interval Note:  10/27/2024 9:48 AM  Kelly [name]  has presented today for surgery, with the diagnosis of CHOLELITHIASIS.  The various methods of treatment have been discussed with the patient and family. After consideration of risks, benefits and other options for treatment, the patient has consented to  Procedure(s): CHOLECYSTECTOMY, ROBOT-ASSISTED, LAPAROSCOPIC (N/A) as a surgical intervention.  The patient's history has been reviewed, patient examined, no change in status, stable for surgery.  I have reviewed the patient's chart and labs.  Questions were answered to the patient's satisfaction.     [name] A Naamah Boggess

## 2024-10-27 NOTE — Anesthesia Preprocedure Evaluation (Addendum)
 Anesthesia Evaluation  Patient identified by MRN, date of birth, ID band Patient awake    Reviewed: Allergy & Precautions, NPO status , Patient's Chart, lab work & pertinent test results, reviewed documented beta blocker date and time   History of Anesthesia Complications (+) PONV and history of anesthetic complications  Airway Mallampati: III  TM Distance: >3 FB Neck ROM: Limited    Dental  (+) Edentulous Upper, Edentulous Lower   Pulmonary former smoker   Pulmonary exam normal breath sounds clear to auscultation       Cardiovascular hypertension, Pt. on home beta blockers and Pt. on medications Normal cardiovascular exam Rhythm:Regular Rate:Normal     Neuro/Psych  PSYCHIATRIC DISORDERS Anxiety     Spondylolisthesis, Cervical region    GI/Hepatic negative GI ROS, Neg liver ROS,,,  Endo/Other  prediabetes  Renal/GU negative Renal ROS     Musculoskeletal  (+) Arthritis ,    Abdominal   Peds  Hematology negative hematology ROS (+)   Anesthesia Other Findings Day of surgery medications reviewed with the patient.  Reproductive/Obstetrics                              Anesthesia Physical Anesthesia Plan  ASA: 2  Anesthesia Plan: General   Post-op Pain Management: Dilaudid  IV   Induction: Intravenous  PONV Risk Score and Plan: 3 and Midazolam , Dexamethasone , Ondansetron , Scopolamine  patch - Pre-op and Diphenhydramine   Airway Management Planned: Oral ETT and Video Laryngoscope Planned  Additional Equipment: None  Intra-op Plan:   Post-operative Plan: Extubation in OR  Informed Consent: I have reviewed the patients History and Physical, chart, labs and discussed the procedure including the risks, benefits and alternatives for the proposed anesthesia with the patient or authorized representative who has indicated his/her understanding and acceptance.     Dental advisory  given  Plan Discussed with: CRNA  Anesthesia Plan Comments:          Anesthesia Quick Evaluation

## 2024-10-27 NOTE — Anesthesia Postprocedure Evaluation (Signed)
 Anesthesia Post Note  Patient: Kelly [name]  Procedure(s) Performed: CHOLECYSTECTOMY, ROBOT-ASSISTED, LAPAROSCOPIC (Abdomen)  Patient location during evaluation: PACU Anesthesia Type: General Level of consciousness: awake and alert Pain management: pain level controlled Vital Signs Assessment: post-procedure vital signs reviewed and stable Respiratory status: spontaneous breathing, nonlabored ventilation, respiratory function stable and patient connected to nasal cannula oxygen Cardiovascular status: blood pressure returned to baseline and stable Postop Assessment: no apparent nausea or vomiting Anesthetic complications: no   There were no known notable events for this encounter.   Last Vitals:  Vitals:   10/27/24 1300 10/27/24 1309  BP: (!) 107/45 (!) 101/37  Pulse: 64 64  Resp: 11 15  Temp:  (!) 36.4 C  SpO2: 92% 96%    Last Pain:  Vitals:   10/27/24 1247  TempSrc:   PainSc: 6                  Jaimes Eckert L Detra Bores

## 2024-10-30 LAB — SURGICAL PATHOLOGY

## 2024-11-02 ENCOUNTER — Telehealth: Payer: Self-pay | Admitting: *Deleted

## 2024-11-02 NOTE — Telephone Encounter (Signed)
 Surgical Date: 10/27/2024 Procedure: XI ROBOTIC ASSISTED LAPAROSCOPIC CHOLECYSTECTOMY   Received call from patient [phone]~ telephone.   Patient reports x1 episode of elevated temperature of 100.6 on 11/01/2024. States that she took IBU and temperature returned to normal. Denies chills, N/V, redness or warmth to incisions or drainage.   Advised that one elevation in temperature is not especially alarming with out other symptoms. Advised to continue to monitor and notify office of any other changes.   Patient also reports some loose stools after eating. Advised that unfortunately, this is common after cholecystectomy as there is no bile from the gallbladder to break down fats that are eaten. Advised to try low fat diet. Advised to notify office if stools continue to be loose.   Patient verbalized understanding.   Patient has follow up on 11/08/2024.

## 2024-11-08 ENCOUNTER — Ambulatory Visit (INDEPENDENT_AMBULATORY_CARE_PROVIDER_SITE_OTHER): Admitting: Surgery

## 2024-11-08 ENCOUNTER — Encounter: Payer: Self-pay | Admitting: Surgery

## 2024-11-08 VITALS — BP 115/67 | HR 52 | Temp 98.0°F | Resp 16 | Ht 66.0 in | Wt 136.0 lb

## 2024-11-08 DIAGNOSIS — Z09 Encounter for follow-up examination after completed treatment for conditions other than malignant neoplasm: Secondary | ICD-10-CM

## 2024-11-08 NOTE — Progress Notes (Signed)
 [facility] Note   HPI:  [name] presents to clinic for post-op follow-up s/p robotic assisted laparoscopic cholecystectomy on 11/7.  Patient has overall been doing well since the surgery.  She denies any significant pain.  Her biggest complaint is loose bowel movements, that specifically seem to be worse when eating things like ice cream.  She denies any issues at her incision sites.  She felt a little bit of fullness at her umbilical incision site, but denies any significant pain.  She denies fevers and chills.  Review of Systems:  All other review of systems: otherwise negative   Vital Signs:  BP 115/67   Pulse (!) 52   Temp 98 F (36.7 C) (Oral)   Resp 16   Ht 5' 6 (1.676 m)   Wt 136 lb (61.7 kg)   SpO2 92%   BMI 21.95 kg/m    Physical Exam:  Physical Exam Vitals reviewed.  Constitutional:      Appearance: Normal appearance.  Abdominal:     Comments: Abdomen soft, nondistended, no percussion tenderness, nontender to palpation; no rigidity, guarding, rebound tenderness; well-healing laparoscopic incision sites with skin glue still in place, mild fullness under umbilical incision site, likely associated seroma  Neurological:     Mental Status: She is alert.     Laboratory studies: None   Imaging:  None  Pathology: A. GALLBLADDER, CHOLECYSTECTOMY:  - Chronic cholecystitis with cholelithiasis.  - Negative for malignancy.   Assessment:  68 y.o. yo Female who presents for follow-up status post robotic assisted laparoscopic cholecystectomy on 11/7  Plan:  - Overall patient is doing well postoperatively.  Tolerating a diet, moving her bowels, and pain well-controlled - I discussed the findings at the time of cholecystectomy with fusiform dilation of the distal cystic/junction of cystic duct to CBD.  I explained that when she is a month out from surgery, I will plan to obtain an MRCP to further evaluate this area for abnormal pathology such as a  choledochocele - Advised that the patient should watch her diet and try to avoid fatty foods to see if this helps with her looser bowel movements - Low-fat diet information provided to the patient - I will call the patient in 3 weeks to evaluate how her loose bowel movements have been doing, and to schedule her for MRCP  All of the above recommendations were discussed with the patient and patient's family, and all of patient's and family's questions were answered to their expressed satisfaction.  Note: Portions of this report may have been transcribed using voice recognition software. Every effort has been made to ensure accuracy; however, inadvertent computerized transcription errors may still be present.   Kelly Brittle, DO [facility] [address] [place], [place] [address] [phone] (office)

## 2024-11-29 ENCOUNTER — Ambulatory Visit (INDEPENDENT_AMBULATORY_CARE_PROVIDER_SITE_OTHER): Admitting: Surgery

## 2024-11-29 DIAGNOSIS — Z09 Encounter for follow-up examination after completed treatment for conditions other than malignant neoplasm: Secondary | ICD-10-CM

## 2024-11-29 DIAGNOSIS — K838 Other specified diseases of biliary tract: Secondary | ICD-10-CM

## 2024-11-29 NOTE — Progress Notes (Signed)
 [facility]  I am calling the patient for post operative evaluation. This is not a billable encounter as it is under the global charges for the surgery.  The patient had a robotic assisted laparoscopic cholecystectomy on 11/19. I was calling the patient to see how her diarrhea has been doing since our last visit, however, she did not answer and I could not leave a voicemail.  Given the fusiform dilation of the distal cystic/at the junction of the cystic duct to CBD, I will plan to order an MRCP to evaluate for choledochal cyst.  Further recommendations to follow imaging.   Dorothyann Brittle, DO [facility] [address] [place], [place] [address] [phone] (office)

## 2024-12-05 ENCOUNTER — Other Ambulatory Visit (HOSPITAL_COMMUNITY): Payer: Self-pay | Admitting: Radiology

## 2024-12-05 ENCOUNTER — Other Ambulatory Visit: Payer: Self-pay | Admitting: Surgery

## 2024-12-05 ENCOUNTER — Ambulatory Visit (HOSPITAL_COMMUNITY): Admission: RE | Admit: 2024-12-05 | Discharge: 2024-12-05 | Attending: Surgery

## 2024-12-05 DIAGNOSIS — K838 Other specified diseases of biliary tract: Secondary | ICD-10-CM

## 2024-12-05 MED ORDER — GADOBUTROL 1 MMOL/ML IV SOLN
5.0000 mL | Freq: Once | INTRAVENOUS | Status: AC | PRN
  Administered 2024-12-05: 11:00:00 5 mL via INTRAVENOUS

## 2024-12-07 ENCOUNTER — Telehealth: Payer: Self-pay | Admitting: Surgery

## 2024-12-07 DIAGNOSIS — K838 Other specified diseases of biliary tract: Secondary | ICD-10-CM

## 2024-12-07 NOTE — Telephone Encounter (Signed)
 [facility]  Called patient to update her on the results of her MRCP.  I explained that the dilation of the CBD is thought to be related to her cholecystectomy.  If the patient is not having any further symptoms, she does not require any further workup.  I advised that if she begins to have worsening right upper quadrant abdominal pain, nausea, vomiting, or jaundiced skin that she should present to the emergency department for evaluation.  All questions were answered to her expressed satisfaction.  MRCP (12/06/24): IMPRESSION: 1. There is mild central intrahepatic bile duct dilation and dilation of the common bile duct measuring up to 13 mm in the midportion. No obstructing mass or choledocholithiasis seen. Findings are favored to represent reservoir effect in the settings of cholecystectomy. 2. Multiple other nonacute observations, as described above.  Dorothyann Brittle, DO [facility] [address] [place], [place] [address] [phone] (office)
